# Patient Record
Sex: Male | Born: 1960 | Race: White | Hispanic: No | Marital: Married | Smoking: Never smoker
Health system: Southern US, Community
[De-identification: ages and names within clinical notes are randomized; demographics above are authoritative.]

## PROBLEM LIST (undated history)

## (undated) DIAGNOSIS — I341 Nonrheumatic mitral (valve) prolapse: Secondary | ICD-10-CM

## (undated) DIAGNOSIS — I219 Acute myocardial infarction, unspecified: Secondary | ICD-10-CM

## (undated) DIAGNOSIS — Q249 Congenital malformation of heart, unspecified: Secondary | ICD-10-CM

## (undated) DIAGNOSIS — I2510 Atherosclerotic heart disease of native coronary artery without angina pectoris: Secondary | ICD-10-CM

## (undated) HISTORY — PX: CARDIAC SURGERY: SHX584

## (undated) HISTORY — PX: APPENDECTOMY: SHX54

## (undated) HISTORY — PX: CHOLECYSTECTOMY: SHX55

## (undated) HISTORY — PX: ARM NEUROPLASTY: SHX1184

---

## 2016-12-10 ENCOUNTER — Observation Stay (HOSPITAL_COMMUNITY)
Admission: EM | Admit: 2016-12-10 | Discharge: 2016-12-12 | Disposition: A | Discharge status: Home / Self Care | Payer: 59 | Attending: Family Medicine | Admitting: Family Medicine

## 2016-12-10 ENCOUNTER — Encounter (HOSPITAL_COMMUNITY): Payer: Self-pay

## 2016-12-10 ENCOUNTER — Emergency Department (HOSPITAL_COMMUNITY): Payer: 59

## 2016-12-10 ENCOUNTER — Other Ambulatory Visit: Payer: Self-pay | Admitting: Emergency Medicine

## 2016-12-10 DIAGNOSIS — R0602 Shortness of breath: Secondary | ICD-10-CM | POA: Diagnosis present

## 2016-12-10 DIAGNOSIS — R110 Nausea: Secondary | ICD-10-CM | POA: Insufficient documentation

## 2016-12-10 DIAGNOSIS — R001 Bradycardia, unspecified: Secondary | ICD-10-CM | POA: Diagnosis present

## 2016-12-10 DIAGNOSIS — Z7982 Long term (current) use of aspirin: Secondary | ICD-10-CM | POA: Diagnosis not present

## 2016-12-10 DIAGNOSIS — R0600 Dyspnea, unspecified: Secondary | ICD-10-CM | POA: Diagnosis not present

## 2016-12-10 DIAGNOSIS — I2510 Atherosclerotic heart disease of native coronary artery without angina pectoris: Secondary | ICD-10-CM | POA: Insufficient documentation

## 2016-12-10 DIAGNOSIS — I959 Hypotension, unspecified: Secondary | ICD-10-CM | POA: Diagnosis not present

## 2016-12-10 DIAGNOSIS — I252 Old myocardial infarction: Secondary | ICD-10-CM | POA: Diagnosis not present

## 2016-12-10 DIAGNOSIS — R55 Syncope and collapse: Secondary | ICD-10-CM | POA: Diagnosis not present

## 2016-12-10 DIAGNOSIS — I517 Cardiomegaly: Principal | ICD-10-CM | POA: Insufficient documentation

## 2016-12-10 DIAGNOSIS — R0989 Other specified symptoms and signs involving the circulatory and respiratory systems: Secondary | ICD-10-CM | POA: Diagnosis present

## 2016-12-10 DIAGNOSIS — R079 Chest pain, unspecified: Secondary | ICD-10-CM | POA: Diagnosis not present

## 2016-12-10 DIAGNOSIS — I5041 Acute combined systolic (congestive) and diastolic (congestive) heart failure: Secondary | ICD-10-CM

## 2016-12-10 DIAGNOSIS — N179 Acute kidney failure, unspecified: Secondary | ICD-10-CM

## 2016-12-10 HISTORY — DX: Congenital malformation of heart, unspecified: Q24.9

## 2016-12-10 HISTORY — DX: Acute myocardial infarction, unspecified: I21.9

## 2016-12-10 HISTORY — DX: Atherosclerotic heart disease of native coronary artery without angina pectoris: I25.10

## 2016-12-10 HISTORY — DX: Nonrheumatic mitral (valve) prolapse: I34.1

## 2016-12-10 LAB — BASIC METABOLIC PANEL
Anion gap: 5 (ref 5–15)
BUN: 11 mg/dL (ref 6–20)
CHLORIDE: 105 mmol/L (ref 101–111)
CO2: 27 mmol/L (ref 22–32)
CREATININE: 1.08 mg/dL (ref 0.61–1.24)
Calcium: 9 mg/dL (ref 8.9–10.3)
GFR calc Af Amer: >60 mL/min (ref >60)
GFR calc non Af Amer: >60 mL/min (ref >60)
Glucose, Bld: 99 mg/dL (ref 65–99)
POTASSIUM: 4.5 mmol/L (ref 3.5–5.1)
SODIUM: 137 mmol/L (ref 135–145)

## 2016-12-10 LAB — TSH: TSH: 5.754 u[IU]/mL — AB (ref 0.350–4.500)

## 2016-12-10 LAB — CBC WITH DIFFERENTIAL/PLATELET
Basophils Absolute: 0 10*3/uL (ref 0.0–0.1)
Basophils Relative: 0 %
EOS PCT: 0 %
Eosinophils Absolute: 0 10*3/uL (ref 0.0–0.7)
HCT: 40.5 % (ref 39.0–52.0)
HEMOGLOBIN: 13.6 g/dL (ref 13.0–17.0)
LYMPHS ABS: 1.6 10*3/uL (ref 0.7–4.0)
LYMPHS PCT: 24 %
MCH: 30.4 pg (ref 26.0–34.0)
MCHC: 33.6 g/dL (ref 30.0–36.0)
MCV: 90.6 fL (ref 78.0–100.0)
MONOS PCT: 6 %
Monocytes Absolute: 0.4 10*3/uL (ref 0.1–1.0)
NEUTROS PCT: 70 %
Neutro Abs: 4.6 10*3/uL (ref 1.7–7.7)
Platelets: 175 10*3/uL (ref 150–400)
RBC: 4.47 MIL/uL (ref 4.22–5.81)
RDW: 12.7 % (ref 11.5–15.5)
WBC: 6.6 10*3/uL (ref 4.0–10.5)

## 2016-12-10 LAB — LIPID PANEL
Cholesterol: 153 mg/dL (ref 0–200)
HDL: 39 mg/dL — ABNORMAL LOW (ref >40)
LDL CALC: 93 mg/dL (ref 0–99)
Total CHOL/HDL Ratio: 3.9 RATIO
Triglycerides: 105 mg/dL (ref <150)
VLDL: 21 mg/dL (ref 0–40)

## 2016-12-10 LAB — BRAIN NATRIURETIC PEPTIDE: B NATRIURETIC PEPTIDE 5: 35.9 pg/mL (ref 0.0–100.0)

## 2016-12-10 LAB — TROPONIN I

## 2016-12-10 LAB — I-STAT TROPONIN, ED: Troponin i, poc: 0 ng/mL (ref 0.00–0.08)

## 2016-12-10 LAB — D-DIMER, QUANTITATIVE: D-Dimer, Quant: <0.27 ug/mL-FEU (ref 0.00–0.50)

## 2016-12-10 MED ORDER — SENNOSIDES-DOCUSATE SODIUM 8.6-50 MG PO TABS
1.0000 | ORAL_TABLET | Freq: Every evening | ORAL | Status: DC | PRN
  Filled 2016-12-10: qty 1

## 2016-12-10 MED ORDER — SODIUM CHLORIDE 0.9% FLUSH
3.0000 mL | Freq: Two times a day (BID) | INTRAVENOUS | Status: DC
  Administered 2016-12-11: 3 mL via INTRAVENOUS

## 2016-12-10 MED ORDER — ONDANSETRON HCL 4 MG PO TABS: 4.0000 mg | ORAL_TABLET | Freq: Four times a day (QID) | ORAL | Status: DC | PRN

## 2016-12-10 MED ORDER — HYDROCODONE-ACETAMINOPHEN 5-325 MG PO TABS
1.0000 | ORAL_TABLET | ORAL | Status: DC | PRN
  Administered 2016-12-10 – 2016-12-11 (×2): 2 via ORAL
  Filled 2016-12-10 (×2): qty 2

## 2016-12-10 MED ORDER — ACETAMINOPHEN 650 MG RE SUPP: 650.0000 mg | Freq: Four times a day (QID) | RECTAL | Status: DC | PRN

## 2016-12-10 MED ORDER — SODIUM CHLORIDE 0.9% FLUSH
3.0000 mL | Freq: Two times a day (BID) | INTRAVENOUS | Status: DC
  Administered 2016-12-10 – 2016-12-12 (×3): 3 mL via INTRAVENOUS

## 2016-12-10 MED ORDER — ONDANSETRON HCL 4 MG/2ML IJ SOLN: 4.0000 mg | Freq: Four times a day (QID) | INTRAMUSCULAR | Status: DC | PRN

## 2016-12-10 MED ORDER — ACETAMINOPHEN 325 MG PO TABS
650.0000 mg | ORAL_TABLET | Freq: Four times a day (QID) | ORAL | Status: DC | PRN
  Administered 2016-12-11: 650 mg via ORAL
  Filled 2016-12-10: qty 2

## 2016-12-10 MED ORDER — ENOXAPARIN SODIUM 40 MG/0.4ML ~~LOC~~ SOLN
40.0000 mg | SUBCUTANEOUS | Status: DC
  Administered 2016-12-10 – 2016-12-11 (×2): 40 mg via SUBCUTANEOUS
  Filled 2016-12-10 (×2): qty 0.4

## 2016-12-10 MED ORDER — ZOLPIDEM TARTRATE 5 MG PO TABS: 5.0000 mg | ORAL_TABLET | Freq: Every evening | ORAL | Status: DC | PRN

## 2016-12-10 MED ORDER — SODIUM CHLORIDE 0.9 % IV SOLN: 250.0000 mL | INTRAVENOUS | Status: DC | PRN

## 2016-12-10 MED ORDER — SODIUM CHLORIDE 0.9 % IV BOLUS (SEPSIS)
500.0000 mL | Freq: Once | INTRAVENOUS | Status: AC
Start: 2016-12-10 — End: 2016-12-10
  Administered 2016-12-10: 500 mL via INTRAVENOUS

## 2016-12-10 NOTE — Consult Note (Signed)
Cardiology Consultation:   Patient ID: Micheal Cross; [id number]; 05-Mar-1961   Admit date: 12/10/2016 Date of Consult: 12/10/2016  Primary Care Provider: Medicine, [facility] Primary Cardiologist: Lorelei Pont MD ([facility]/ [phone]) Primary Electrophysiologist:  None   Patient Profile:   Micheal Cross is a 56 y.o. 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 Neg Nuc stress test in 2015..  The patient is being seen today for the evaluation of chest pain and CHF at the request of Dr. Konrad Dolores.  History of Present Illness:   Micheal Cross 56 yo male Per Care Everywhere patient had been admitted in 2015 with chest pain syndrome and had an echocardiogram which showed normal LV function with mild valvular disease. He had a negative nuclear stress test.  He present to the ER with worsening SOB, occasional nocturnal dyspnea gradually worsening over 1 month. Today his SOB because suddenly worsened, it was stabbing left sided chest pain with associated nausea, left arm numbness, lightheadedness, near syncope while lifting steel in the past two weeks. Has had cramping in his legs but no swelling. Symptoms improved with SL nitro given in route.  ER work-up revealed normal BMP and CBC, LDL is 93, Neg d-dimer. Chest xray-- showed cardiomegaly and mild pulmonary vascular congestion is concerning for early congestive heart failure. BNP 35.9, Patient admitted to [facility] for CP rule out. HR 50s, respi 14, BP 100/66, 98% on room air.  The patient describes feeling so short of breath that his vision went black and he laid down on the ground before he passed out. When EMS arrived they helped him stand up and he felt that he would pass out again. He was given fluids before orthostatics could be checked.  He says that he did drink fluids and have breakfasts. He is concerned about his extensive cardiac hx and  family history. Internal medicine plans to cycle enzymes and do an echocardiogram.   Past Medical History:  Diagnosis Date  . Congenital Heart Disease   . Mitral valve prolapse     Past Surgical History:  Procedure Laterality Date  . APPENDECTOMY    . ARM NEUROPLASTY    . CHOLECYSTECTOMY       Inpatient Medications: Scheduled Meds: . enoxaparin (LOVENOX) injection  40 mg Subcutaneous Q24H  . sodium chloride flush  3 mL Intravenous Q12H  . sodium chloride flush  3 mL Intravenous Q12H   Continuous Infusions: . sodium chloride     PRN Meds: sodium chloride, acetaminophen **OR** acetaminophen, HYDROcodone-acetaminophen, ondansetron **OR** ondansetron (ZOFRAN) IV, senna-docusate, zolpidem  Allergies:   No Known Allergies  Social History:   Social History   Social History  . Marital status: Married    Spouse name: N/A  . Number of children: N/A  . Years of education: N/A   Occupational History  . Not on file.   Social History Main Topics  . Smoking status: Never Smoker  . Smokeless tobacco: Never Used  . Alcohol use No  . Drug use: No  . Sexual activity: Not on file   Other Topics Concern  . Not on file   Social History Narrative  . No narrative on file      Family History:  The patient's family history includes Alzheimer's disease in his mother; Bipolar disorder in his mother and sister; COPD in his mother; Colon cancer in his sister; Diabetes in his father; Heart attack  in his maternal grandmother and paternal grandmother; Heart attack (age of onset: 8453) in his father; Heart disease in his maternal grandfather and paternal grandfather; Hyperlipidemia in his father; Hypertension in his father; Migraines in his sister; Multiple sclerosis in his sister; Thyroid disease in his father and sister.  ROS:  Please see the history of present illness.  All other ROS reviewed and negative.     Physical Exam/Data:   Vitals:   12/10/16 1500 12/10/16 1515 12/10/16 1600  12/10/16 1700  BP: 98/65 101/66 (!) 112/53   Pulse: (!) 59 (!) 55 66   Resp: 17 14 15 20   Temp:    97.7 F (36.5 C)  TempSrc:    Oral  SpO2: 96% 98% 100% 100%  Weight:      Height:       No intake or output data in the 24 hours ending 12/10/16 1741 Filed Weights   12/10/16 1209  Weight: 158 lb (71.7 kg)   Body mass index is 26.29 kg/m.  General: Well developed, well nourished, in no acute distress.  Head: Normocephalic, atraumatic, Neck: Negative for carotid bruits. JVD not elevated. Lungs: Clear bilaterally to auscultation without wheezes, rales, or rhonchi. Breathing is unlabored. Heart: RRR with S1 S2. No murmurs, rubs, or gallops appreciated. Abdomen: Soft, non-tender, non-distended with normoactive bowel sounds. Msk:  Strength and tone appear normal for age. Extremities: No clubbing or cyanosis. No edema.  Neuro: Alert and oriented X 3. No facial asymmetry. Psych:  Responds to questions appropriately with a normal affect.  EKG:  The EKG was personally reviewed and demonstrates HR 63, NSR  Relevant CV Studies: Echocardiogram is pending.  Laboratory Data:  Chemistry  Recent Labs Lab 12/10/16 1257  NA 137  K 4.5  CL 105  CO2 27  GLUCOSE 99  BUN 11  CREATININE 1.08  CALCIUM 9.0  GFRNONAA >60  GFRAA >60  ANIONGAP 5    No results for input(s): PROT, ALBUMIN, AST, ALT, ALKPHOS, BILITOT in the last 168 hours. Hematology  Recent Labs Lab 12/10/16 1257  WBC 6.6  RBC 4.47  HGB 13.6  HCT 40.5  MCV 90.6  MCH 30.4  MCHC 33.6  RDW 12.7  PLT 175   Cardiac EnzymesNo results for input(s): TROPONINI in the last 168 hours.   Recent Labs Lab 12/10/16 1307  TROPIPOC 0.00    BNP  Recent Labs Lab 12/10/16 1257  BNP 35.9    DDimer   Recent Labs Lab 12/10/16 1257  DDIMER <0.27    Radiology/Studies:  Dg Chest Portable 1 View  Result Date: 12/10/2016 CLINICAL DATA:  Left central chest pain extending to left upper extremity. Shortness of breath.  EXAM: PORTABLE CHEST 1 VIEW COMPARISON:  None. FINDINGS: The heart is mildly enlarged. Mild pulmonary vascular congestion is present. There is no frank edema. No focal airspace disease is present. There are no definite effusions. The visualized soft tissues and bony thorax are unremarkable. IMPRESSION: 1. Cardiomegaly and mild pulmonary vascular congestion is concerning for early congestive heart failure. Electronically Signed   By: Marin Robertshristopher  Mattern M.D.   On: 12/10/2016 12:54    Assessment and Plan:    1. Shortness of breath with chest pains: Pt reports having any MI in 2015,I could not find documentation of this. He had a normal Stress Nuc in 2015 but says he passed out during the test. He has been having progressively worsening episodes of pain. Lab/image wise he does not appear fluid overload but clinically he is  described fluid overload. His presentation is atypical with ongoing sharp stabbing pleuritic sounding pains to his left chest. He does have typical symptoms of chest pressure/weight and diaphoresis. -- agree with cycling troponins and echocardiogram. -- keep NPO after midnight. If troponin turns positive, chest pain persists, EKG changes or or echocardiogram is abnormal then will consider Nuc stress test.  2. Near-Syncope: pt sounds like he was orthostatic on arrival, he has been hydrated with ~ 500 mL. Has been up and ambulatory since arriving to the hospital without orthostasis s/sx.    Signed, Micheal Rotunda, MD  12/10/2016 5:41 PM  History and all data above reviewed.  Patient examined.  I agree with the findings as above.  The patient has a past history of chest pain with a negative stress test in the past.  He reports that he has had an MI but he says that he has never had a cath before.  I have no details on this.  He presents with stabbing chest pains today.  he also reports some palpitations.  Today he had presyncope.  The patient exam reveals COR:RRR  ,  Lungs: Clear  ,   Abd: Positive bowel sounds, no rebound no guarding, Ext No edemqa  .  All available labs, radiology testing, previous records reviewed. Agree with documented assessment and plan. Chest pain:  This is atypical.  There is no objective evidence of ischemia.  He has some early repol changes on his EKG.  Initial enzymes are negative.  There was a mention of vascular congestion on chest X ray .  However, he has a normal BNP and no physical evidence of CHF.  If his enzymes are negative I will plan an exercise perfusion study in the AM.  Palpitations:  He reports these and near syncope.  He will need an out patient event monitor.     Micheal Cross  5:42 PM  12/10/2016

## 2016-12-10 NOTE — H&P (Signed)
History and Physical    Micheal Cross ZOX:[id number] DOB: 1960/11/14 DOA: 12/10/2016  PCP: Medicine, [facility] Patient coming from: work  Chief Complaint: syncope, chest pain  HPI: Micheal Cross is a 56 y.o. male with medical history significant congenital heart defect surgical repair 1964, CAD, chest pain syndrome presents to emergency Department chief complaint of syncope and chest pain. Initial evaluation reveals initial troponin negative EKG without acute changes he does have a slightly soft blood pressures somewhat bradycardic. 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 He reports he was "sweating a lot". Suddenly he felt a sharp stabbing pain in his left anterior chest. Associated symptoms include shortness of breath nausea without vomiting left arm tingling. He states "I could feel myself passing out so I laid down on the ground". He states he could hear people talking around him but he could not respond. EMS was called when he awakened and they helped him up he felt "dizzy". He reports drinking lots of water and Gatorade to maintain his hydration. 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 He denies headache lower extremity edema orthopnea. He denies fever chills abdominal pain dysuria hematuria frequency or urgency. He states he's had this in the past and he has had a stress test at which he "passed out and they told me it was a normal test". He was given aspirin and nitroglycerin have the time of admission he is pain-free.    ED Course: In the emergency department he's afebrile, Lopressor the low end of normal. Heart rate range 50-64 he is not hypoxic.  Review of Systems: As per HPI otherwise all other systems reviewed and are negative.   Ambulatory Status: Ambulates independently is independent with  ADLs.  Past Medical History:  Diagnosis Date  . Coronary artery disease   . Mitral valve prolapse   . Myocardial infarction [facility])     Past Surgical History:  Procedure Laterality Date  . APPENDECTOMY    . ARM NEUROPLASTY    . CHOLECYSTECTOMY    . CORONARY ARTERY BYPASS GRAFT      Social History   Social History  . Marital status: Married    Spouse name: N/A  . Number of children: N/A  . Years of education: N/A   Occupational History  . Not on file.   Social History Main Topics  . Smoking status: Never Smoker  . Smokeless tobacco: Never Used  . Alcohol use No  . Drug use: No  . Sexual activity: Not on file   Other Topics Concern  . Not on file   Social History Narrative  . No narrative on file    No Known Allergies  Family History  Problem Relation Age of Onset  . COPD Mother   . Bipolar disorder Mother   . Alzheimer's disease Mother   . Heart attack Father   . Diabetes Father   . Heart disease Father   . Hypertension Father   . Hyperlipidemia Father   . Thyroid disease Father   . Colon cancer Sister   . Bipolar disorder Sister   . Migraines Sister   . Thyroid disease Sister   . Multiple sclerosis Sister   . Heart attack Maternal Grandmother   . Heart disease Maternal Grandfather   . Heart attack Paternal Grandmother   .  Heart disease Paternal Grandfather     Prior to Admission medications   Medication Sig Start Date End Date Taking? Authorizing Provider  acetaminophen (TYLENOL) 325 MG tablet Take 650 mg by mouth every 6 (six) hours as needed for mild pain.   Yes [provider]  aspirin 81 MG chewable tablet Chew 81 mg by mouth daily.   Yes [provider]  ibuprofen (ADVIL,MOTRIN) 200 MG tablet Take 200 mg by mouth every 6 (six) hours as needed for moderate pain.   Yes [provider]  Multiple Vitamin (MULTIVITAMIN) tablet Take 1 tablet by mouth daily.   Yes [provider]  POTASSIUM PO Take 1 capsule by  mouth daily.   Yes [provider]  vitamin E 400 UNIT capsule Take 400 Units by mouth daily.   Yes [provider]    Physical Exam: Vitals:   12/10/16 1430 12/10/16 1445 12/10/16 1500 12/10/16 1515  BP: 100/67 (!) 99/47 98/65 101/66  Pulse: (!) 57 (!) 59 (!) 59 (!) 55  Resp: 16 17 17 14   Temp:      SpO2: 100% 98% 96% 98%  Weight:      Height:         General:  Appears calm and comfortable Eyes:  PERRL, EOMI, normal lids, iris ENT:  grossly normal hearing, lips & tongue, mmm Neck:  no LAD, masses or thyromegaly Cardiovascular:  RRR, no m/r/g. No LE edema.  Respiratory:  CTA bilaterally, no w/r/r. Normal respiratory effort. Abdomen:  soft, ntnd, NABS Skin:  no rash or induration seen on limited exam Musculoskeletal:  grossly normal tone BUE/BLE, good ROM, no bony abnormality Psychiatric:  grossly normal mood and affect, speech fluent and appropriate, AOx3 Neurologic:  CN 2-12 grossly intact, moves all extremities in coordinated fashion, sensation intact  Labs on Admission: I have personally reviewed following labs and imaging studies  CBC:  Recent Labs Lab 12/10/16 1257  WBC 6.6  NEUTROABS 4.6  HGB 13.6  HCT 40.5  MCV 90.6  PLT 175   Basic Metabolic Panel:  Recent Labs Lab 12/10/16 1257  NA 137  K 4.5  CL 105  CO2 27  GLUCOSE 99  BUN 11  CREATININE 1.08  CALCIUM 9.0   GFR: Estimated Creatinine Clearance: 68 mL/min (by C-G formula based on SCr of 1.08 mg/dL). Liver Function Tests: No results for input(s): AST, ALT, ALKPHOS, BILITOT, PROT, ALBUMIN in the last 168 hours. No results for input(s): LIPASE, AMYLASE in the last 168 hours. No results for input(s): AMMONIA in the last 168 hours. Coagulation Profile: No results for input(s): INR, PROTIME in the last 168 hours. Cardiac Enzymes: No results for input(s): CKTOTAL, CKMB, CKMBINDEX, TROPONINI in the last 168 hours. BNP (last 3 results) No results for input(s): PROBNP in the last  8760 hours. HbA1C: No results for input(s): HGBA1C in the last 72 hours. CBG: No results for input(s): GLUCAP in the last 168 hours. Lipid Profile:  Recent Labs  12/10/16 1257  CHOL 153  HDL 39*  LDLCALC 93  TRIG [id number]  CHOLHDL 3.9   Thyroid Function Tests: No results for input(s): TSH, T4TOTAL, FREET4, T3FREE, THYROIDAB in the last 72 hours. Anemia Panel: No results for input(s): VITAMINB12, FOLATE, FERRITIN, TIBC, IRON, RETICCTPCT in the last 72 hours. Urine analysis: No results found for: COLORURINE, APPEARANCEUR, LABSPEC, PHURINE, GLUCOSEU, HGBUR, BILIRUBINUR, KETONESUR, PROTEINUR, UROBILINOGEN, NITRITE, LEUKOCYTESUR  Creatinine Clearance: Estimated Creatinine Clearance: 68 mL/min (by C-G formula based on SCr of 1.08 mg/dL).  Sepsis Labs: @LABRCNTIP (procalcitonin:4,lacticidven:4) )No results found for this or any previous visit (from the past 240 hour(s)).   Radiological Exams on Admission: Dg Chest Portable 1 View  Result Date: 12/10/2016 CLINICAL DATA:  Left central chest pain extending to left upper extremity. Shortness of breath. EXAM: PORTABLE CHEST 1 VIEW COMPARISON:  None. FINDINGS: The heart is mildly enlarged. Mild pulmonary vascular congestion is present. There is no frank edema. No focal airspace disease is present. There are no definite effusions. The visualized soft tissues and bony thorax are unremarkable. IMPRESSION: 1. Cardiomegaly and mild pulmonary vascular congestion is concerning for early congestive heart failure. Electronically Signed   By: Marin Roberts M.D.   On: 12/10/2016 12:54    EKG: Independently reviewed. Sinus rhythm Borderline right axis deviation ST elev, probable normal early repol pattern No STEMI. No old for comparison  Assessment/Plan Principal Problem:   Syncope, near Active Problems:   Chest pain   Bradycardia   Pulmonary congestion   Hypotension   #1. Syncope, near. Patient reports lying down before he passed out but  does state that he passed out. Sounds like he might of gotten dehydrated doing physical labor in the heat or possible vagal response with heavy lifting. He insists he has been drinking water and maintaining his hydration. EKG with sinus rhythm right axis deviation. Chest x-ray with cardiomegaly and mild pulmonary vascular congestion. BNP 35. Was not orthostatic on admission -Admit to telemetry -Cycle troponin -Serial EKG -Echocardiogram -IV fluids -Cardiology consult  #2. Chest pain/pulmonary congestion. Chart review indicates patient with a history of same. He states he had a stress test in 2015 and he "passed out". Chart review indicates this test was within the limits of normal. Chart review also reveals echo in the past with an EF of 55% and mild tricuspid and mitral valve regurg. Initial troponin negative. EKG as noted above. Chest x-ray as noted above. He is pain-free on admission. -Cycle troponins -Serial EKG -Echocardiogram -Supportive therapy -Cardiology consult  #3. Hypotension/bradycardia. Chest x-ray as noted above. EKG as noted above. May be related to a volume issue -IV fluids -Cardiology consult -Repeat orthostatics -Obtain TSH    DVT prophylaxis: lovenox  Code Status: full  Family Communication: wife and sister at bedside  Disposition Plan: home hopefully in am  Consults called: cardiology  Admission status: obs    Gwenyth Bender MD [facility]  If 7PM-7AM, please contact night-coverage [email or web address] Password [facility]  12/10/2016, 3:51 PM

## 2016-12-10 NOTE — ED Provider Notes (Signed)
[facility]-EMERG[facility]Y DEPT Provider Note   CSN: [id number] Arrival date & time: 12/10/16  1207     History   Chief Complaint Chief Complaint  Patient presents with  . Chest Pain    HPI Micheal Cross is a 56 y.o. male.  HPI   Pt with hx HLD, CAD, MI, congenital heart defect repair in 1964 p/w 1 month gradually worsening SOB, occasional nocturnal dyspnea.  Today he developed sudden SOB, stabbing left sided chest pain, nausea, left arm numbness, and lightheadedness, near syncope while lifting steel onto the back of a truck.  He is a Naval architect by profession and drove to [place] twice in the past two weeks.  Has cramping in his legs but no swelling.   Was given nitro on the way to the ED today with improvement of symptoms.  Continues to have left sided chest pain and SOB, nausea.   Cardiologist is in [place], [place], at [facility] - last stress test was several years ago during which he lost consciousness but was told it was a normal test.  Possible MI in 2015.    Past Medical History:  Diagnosis Date  . Congenital heart disease   . Coronary artery disease   . Mitral valve prolapse   . Myocardial infarction [facility])     Patient Active Problem List   Diagnosis Date Noted  . Chest pain 12/10/2016  . Bradycardia 12/10/2016  . Syncope, near 12/10/2016  . Pulmonary congestion 12/10/2016  . Hypotension 12/10/2016  . Dyspnea   . Acute combined systolic and diastolic congestive heart failure [facility])     Past Surgical History:  Procedure Laterality Date  . APPENDECTOMY    . ARM NEUROPLASTY    . CARDIAC SURGERY     Repair of congenital heart disease (no details)  . CHOLECYSTECTOMY         Home Medications    Prior to Admission medications   Medication Sig Start Date End Date Taking? Authorizing Provider  acetaminophen (TYLENOL) 325 MG tablet Take 650 mg by mouth every 6 (six) hours as needed for mild pain.   Yes [provider]  aspirin 81 MG chewable tablet Chew 81 mg  by mouth daily.   Yes [provider]  ibuprofen (ADVIL,MOTRIN) 200 MG tablet Take 200 mg by mouth every 6 (six) hours as needed for moderate pain.   Yes [provider]  Multiple Vitamin (MULTIVITAMIN) tablet Take 1 tablet by mouth daily.   Yes [provider]  POTASSIUM PO Take 1 capsule by mouth daily.   Yes [provider]  vitamin E 400 UNIT capsule Take 400 Units by mouth daily.   Yes [provider]    Family History Family History  Problem Relation Age of Onset  . COPD Mother   . Bipolar disorder Mother   . Alzheimer's disease Mother   . Heart attack Father 41  . Diabetes Father   . Hypertension Father   . Hyperlipidemia Father   . Thyroid disease Father   . Colon cancer Sister   . Bipolar disorder Sister   . Migraines Sister   . Thyroid disease Sister   . Multiple sclerosis Sister   . Heart attack Maternal Grandmother   . Heart disease Maternal Grandfather   . Heart attack Paternal Grandmother   . Heart disease Paternal Grandfather     Social History Social History  Substance Use Topics  . Smoking status: Never Smoker  . Smokeless tobacco: Never Used  . Alcohol use  No     Allergies   Patient has no known allergies.   Review of Systems Review of Systems  All other systems reviewed and are negative.    Physical Exam Updated Vital Signs BP 113/64   Pulse 61   Temp 98.1 F (36.7 C) (Oral)   Resp 20   Ht 5\' 5"  (1.651 m)   Wt 70.9 kg (156 lb 3.2 oz)   SpO2 96%   BMI 25.99 kg/m   Physical Exam  Constitutional: He appears well-developed and well-nourished. No distress.  HENT:  Head: Normocephalic and atraumatic.  Neck: Neck supple.  Cardiovascular: Normal rate and regular rhythm.   Pulmonary/Chest: Effort normal and breath sounds normal. No respiratory distress. He has no wheezes. He has no rales.  Abdominal: Soft. He exhibits no distension and no mass. There is no tenderness. There is no rebound and no  guarding.  Musculoskeletal: He exhibits no edema.  Neurological: He is alert. He exhibits normal muscle tone.  Skin: He is not diaphoretic.  Nursing note and vitals reviewed.    ED Treatments / Results  Labs (all labs ordered are listed, but only abnormal results are displayed) Labs Reviewed  LIPID PANEL - Abnormal; Notable for the following:       Result Value   HDL 39 (*)    All other components within normal limits  TSH - Abnormal; Notable for the following:    TSH 5.754 (*)    All other components within normal limits  BASIC METABOLIC PANEL - Abnormal; Notable for the following:    Glucose, Bld 123 (*)    Creatinine, Ser 1.34 (*)    Calcium 8.6 (*)    GFR calc non Af Amer 59 (*)    All other components within normal limits  GLUCOSE, CAPILLARY - Abnormal; Notable for the following:    Glucose-Capillary 104 (*)    All other components within normal limits  BASIC METABOLIC PANEL  CBC WITH DIFFERENTIAL/PLATELET  D-DIMER, QUANTITATIVE (NOT AT [facility])  BRAIN NATRIURETIC PEPTIDE  TROPONIN I  TROPONIN I  CBC  HIV ANTIBODY (ROUTINE TESTING)  I-STAT TROPOININ, ED    EKG  EKG Interpretation  Date/Time:  Monday December 10 2016 12:10:46 EDT Ventricular Rate:  63 PR Interval:    QRS Duration: 79 QT Interval:  394 QTC Calculation: 404 R Axis:   90 Text Interpretation:  Sinus rhythm Borderline right axis deviation ST elev, probable normal early repol pattern No STEMI. No old for comparison.  Confirmed by Alona BeneLong, Joshua [phone]([id number]) on 12/10/2016 12:19:36 PM       Radiology Dg Chest Portable 1 View  Result Date: 12/10/2016 CLINICAL DATA:  Left central chest pain extending to left upper extremity. Shortness of breath. EXAM: PORTABLE CHEST 1 VIEW COMPARISON:  None. FINDINGS: The heart is mildly enlarged. Mild pulmonary vascular congestion is present. There is no frank edema. No focal airspace disease is present. There are no definite effusions. The visualized soft tissues and bony thorax  are unremarkable. IMPRESSION: 1. Cardiomegaly and mild pulmonary vascular congestion is concerning for early congestive heart failure. Electronically Signed   By: Marin Robertshristopher  Mattern M.D.   On: 12/10/2016 12:54    Procedures Procedures (including critical care time)  Medications Ordered in ED Medications  sodium chloride flush (NS) 0.9 % injection 3 mL (3 mLs Intravenous Given 12/11/16 1000)  enoxaparin (LOVENOX) injection 40 mg (40 mg Subcutaneous Given 12/10/16 2127)  acetaminophen (TYLENOL) tablet 650 mg (650 mg Oral Given 12/11/16 0535)  Or  acetaminophen (TYLENOL) suppository 650 mg ( Rectal See Alternative 12/11/16 0535)  sodium chloride flush (NS) 0.9 % injection 3 mL (3 mLs Intravenous Given 12/10/16 2129)  0.9 %  sodium chloride infusion (not administered)  HYDROcodone-acetaminophen (NORCO/VICODIN) 5-325 MG per tablet 1-2 tablet (2 tablets Oral Given 12/10/16 2127)  zolpidem (AMBIEN) tablet 5 mg (not administered)  senna-docusate (Senokot-S) tablet 1 tablet (not administered)  ondansetron (ZOFRAN) tablet 4 mg (not administered)    Or  ondansetron (ZOFRAN) injection 4 mg (not administered)  sodium chloride 0.9 % bolus 500 mL (0 mLs Intravenous Stopped 12/10/16 1354)  technetium tetrofosmin (TC-MYOVIEW) injection 10 millicurie (10 millicuries Intravenous Contrast Given 12/11/16 0835)  technetium tetrofosmin (TC-MYOVIEW) injection 30 millicurie (30 millicuries Intravenous Contrast Given 12/11/16 1015)     Initial Impression / Assessment and Plan / ED Course  I have reviewed the triage vital signs and the nursing notes.  Pertinent labs & imaging results that were available during my care of the patient were reviewed by me and considered in my medical decision making (see chart for details).  Clinical Course as of Dec 11 1101  Mon Dec 10, 2016  1412 I spoke with Toya Smothers, APP [facility], who accepts patient for admission.  Will also call cardiology for consult.    [EW]    1421 I spoke with Trish, cardmaster.  Cardiology will consult.    [EW]    Clinical Course User Index [EW] Trixie Dredge, [place]    Pt with chest pain, near syncope with exertion.  Initial workup demonstrates possible early CHF.  Admitted to [facility] for further evaluation and rule out ACS.  Cardiology to consult.    Final Clinical Impressions(s) / ED Diagnoses   Final diagnoses:  Chest pain, unspecified type  Dyspnea, unspecified type  Pulmonary vascular congestion  Near syncope  Cardiomegaly    New Prescriptions Current Discharge Medication List       Trixie Dredge, [place] 12/11/16 1106    Long, Arlyss Repress, MD 12/11/16 1525

## 2016-12-10 NOTE — ED Notes (Signed)
Attempted report x1. 

## 2016-12-10 NOTE — ED Triage Notes (Addendum)
Pt presents to the ed with complaints of central left chest pain that is sharp radiating into his left arm that feels tingly, pt received 1 nitro with ems that did help. Reports shortness of breath over the last couple weeks at rest.  Pt has an extensive cardiac history. Pt received 1 nitro 324 asa and 4 of zofran in route

## 2016-12-10 NOTE — ED Notes (Signed)
Admitting doctor at bedside 

## 2016-12-11 ENCOUNTER — Other Ambulatory Visit: Payer: Self-pay | Admitting: Student

## 2016-12-11 ENCOUNTER — Observation Stay (HOSPITAL_BASED_OUTPATIENT_CLINIC_OR_DEPARTMENT_OTHER): Payer: 59

## 2016-12-11 ENCOUNTER — Other Ambulatory Visit: Payer: Self-pay | Admitting: Cardiology

## 2016-12-11 DIAGNOSIS — R079 Chest pain, unspecified: Secondary | ICD-10-CM | POA: Diagnosis not present

## 2016-12-11 DIAGNOSIS — N179 Acute kidney failure, unspecified: Secondary | ICD-10-CM

## 2016-12-11 DIAGNOSIS — R002 Palpitations: Secondary | ICD-10-CM

## 2016-12-11 DIAGNOSIS — I959 Hypotension, unspecified: Secondary | ICD-10-CM | POA: Diagnosis not present

## 2016-12-11 DIAGNOSIS — R55 Syncope and collapse: Secondary | ICD-10-CM

## 2016-12-11 DIAGNOSIS — I495 Sick sinus syndrome: Secondary | ICD-10-CM

## 2016-12-11 LAB — ECHOCARDIOGRAM COMPLETE
Ao-asc: 35 cm
E decel time: 229 msec
EERAT: 10.8
FS: 39 % (ref 28–44)
HEIGHTINCHES: 65 in
IVS/LV PW RATIO, ED: 0.97
LA ID, A-P, ES: 30 mm
LA diam end sys: 30 mm
LA diam index: 1.65 cm/m2
LA vol A4C: 96.6 ml
LA vol: 85.9 mL
LAVOLIN: 47.3 mL/m2
LV E/e' medial: 10.8
LV TDI E'LATERAL: 9.03
LVEEAVG: 10.8
LVELAT: 9.03 cm/s
LVOT area: 3.14 cm2
LVOTD: 20 mm
MV Dec: 229
MV Peak grad: 4 mmHg
MV pk A vel: 61.1 m/s
MVPKEVEL: 97.5 m/s
PW: 9.87 mm — AB (ref 0.6–1.1)
RV LATERAL S' VELOCITY: 12.4 cm/s
TAPSE: 30.5 mm
TDI e' medial: 8.92
WEIGHTICAEL: 2499.2 [oz_av]

## 2016-12-11 LAB — CBC
HEMATOCRIT: 40.5 % (ref 39.0–52.0)
HEMOGLOBIN: 13 g/dL (ref 13.0–17.0)
MCH: 29.5 pg (ref 26.0–34.0)
MCHC: 32.1 g/dL (ref 30.0–36.0)
MCV: 92 fL (ref 78.0–100.0)
Platelets: 173 10*3/uL (ref 150–400)
RBC: 4.4 MIL/uL (ref 4.22–5.81)
RDW: 12.8 % (ref 11.5–15.5)
WBC: 7.3 10*3/uL (ref 4.0–10.5)

## 2016-12-11 LAB — BASIC METABOLIC PANEL
ANION GAP: 5 (ref 5–15)
BUN: 13 mg/dL (ref 6–20)
CHLORIDE: 105 mmol/L (ref 101–111)
CO2: 27 mmol/L (ref 22–32)
Calcium: 8.6 mg/dL — ABNORMAL LOW (ref 8.9–10.3)
Creatinine, Ser: 1.34 mg/dL — ABNORMAL HIGH (ref 0.61–1.24)
GFR calc non Af Amer: 59 mL/min — ABNORMAL LOW (ref >60)
Glucose, Bld: 123 mg/dL — ABNORMAL HIGH (ref 65–99)
Potassium: 3.8 mmol/L (ref 3.5–5.1)
Sodium: 137 mmol/L (ref 135–145)

## 2016-12-11 LAB — NM MYOCAR MULTI W/SPECT W/WALL MOTION / EF
CSEPED: 8 min
CSEPHR: 94 %
CSEPPHR: 157 {beats}/min
Estimated workload: 10.1 METS
Exercise duration (sec): 30 s
MPHR: 166 {beats}/min
Rest HR: 58 {beats}/min

## 2016-12-11 LAB — GLUCOSE, CAPILLARY: Glucose-Capillary: 104 mg/dL — ABNORMAL HIGH (ref 65–99)

## 2016-12-11 MED ORDER — TECHNETIUM TC 99M TETROFOSMIN IV KIT
10.0000 | PACK | Freq: Once | INTRAVENOUS | Status: AC | PRN
  Administered 2016-12-11: 10 via INTRAVENOUS

## 2016-12-11 MED ORDER — TECHNETIUM TC 99M TETROFOSMIN IV KIT
30.0000 | PACK | Freq: Once | INTRAVENOUS | Status: AC | PRN
  Administered 2016-12-11: 30 via INTRAVENOUS

## 2016-12-11 MED ORDER — SODIUM CHLORIDE 0.9 % IV BOLUS (SEPSIS): 1000.0000 mL | Freq: Once | INTRAVENOUS | Status: DC

## 2016-12-11 MED ORDER — SODIUM CHLORIDE 0.9 % IV SOLN
INTRAVENOUS | Status: DC
  Administered 2016-12-11: 20:00:00 via INTRAVENOUS

## 2016-12-11 NOTE — Plan of Care (Signed)
Problem: Safety: Goal: Ability to remain free from injury will improve Outcome: Progressing Wife at bedside. Assists to bathroom. Urinal given as alternative to walking to the bathroom due to headache and dizziness which patient stated he came in with.   Problem: Physical Regulation: Goal: Ability to maintain clinical measurements within normal limits will improve Outcome: Progressing At rest, patient's HR maintained in the 40-50's. At times could go up into the 60's. In chart, a strip was documented where there were multiple beats (that equated to 70 bmp) then one missed beat and went back into SB 40-50's. Patient has had no new symptoms overnight. Norco worked for Headache/Intermittent chest pain.   Comments: Patient NPO for stress test this morning.

## 2016-12-11 NOTE — Progress Notes (Addendum)
[facility] PROGRESS NOTE  Percell BostonGerek Cross MWN:[id number] DOB: April 25, 1961 DOA: 12/10/2016 PCP: Medicine, [facility]  Assessment/Plan:  #1. Dehydration and weakness (not syncope per cardio) -Admitted to telemetry -Cycled troponin -stress test neg, low risk -IV fluids -Cardiology consult  #2. Chest pain/pulmonary congestion. Chart review indicates patient with a history of same.  - ECHO complete - OP cardio fu  #3. Hypotension/bradycardia. Chest x-ray as noted above. EKG as noted above. May be related to a volume issue - gentle fluids overnight - d/c tomorrow if doing well - OP will get heart monitor per cardio  #4. AKI Likely from low BP (ATN), pt ambulatory and well appearing Baseline Cr 1.08, Cr 1.34 today Gentle hydration overnight FENA labs ordered I&O RENAL U/S ordered   Code Status: full Family Communication: wife at bedside (indicate person spoken with, relationship, and if by phone, the number) Disposition Plan: home, possibly tomorrow afternoon; pulm referral on d/c   Consultants:  cardio  Procedures:  none  Antibiotics:  n/a (indicate start date, and stop date if known)  HPI/Subjective: No events overnight.  Objective: Vitals:   12/11/16 1218 12/11/16 1500  BP: 97/66 (!) 87/41  Pulse:  (!) 56  Resp:  18  Temp:  98.4 F (36.9 C)    Intake/Output Summary (Last 24 hours) at 12/11/16 1813 Last data filed at 12/11/16 1200  Gross per 24 hour  Intake              440 ml  Output                0 ml  Net              440 ml   Filed Weights   12/10/16 1209 12/11/16 0615  Weight: 71.7 kg (158 lb) 70.9 kg (156 lb 3.2 oz)    Exam:  Physical Exam  Constitutional: She is oriented to person, place, and time. She appears well-developed and well-nourished.  HENT:  Head: Normocephalic and atraumatic.  Eyes: Pupils are equal, round, and reactive to light. EOM are normal.  Cardiovascular: Normal rate and regular rhythm.    Pulmonary/Chest: Effort normal and breath sounds normal. No respiratory distress.  Abdominal: Soft. Bowel sounds are normal. There is no tenderness.  Neurological: She is alert and oriented to person, place, and time. No cranial nerve deficit.     Data Reviewed: Basic Metabolic Panel:  Recent Labs Lab 12/10/16 1257 12/11/16 0201  NA 137 137  K 4.5 3.8  CL 105 105  CO2 27 27  GLUCOSE 99 123*  BUN 11 13  CREATININE 1.08 1.34*  CALCIUM 9.0 8.6*   Liver Function Tests: No results for input(s): AST, ALT, ALKPHOS, BILITOT, PROT, ALBUMIN in the last 168 hours. No results for input(s): LIPASE, AMYLASE in the last 168 hours. No results for input(s): AMMONIA in the last 168 hours. CBC:  Recent Labs Lab 12/10/16 1257 12/11/16 0201  WBC 6.6 7.3  NEUTROABS 4.6  --   HGB 13.6 13.0  HCT 40.5 40.5  MCV 90.6 92.0  PLT 175 173   Cardiac Enzymes:  Recent Labs Lab 12/10/16 1636 12/10/16 1941  TROPONINI <0.03 <0.03   BNP (last 3 results)  Recent Labs  12/10/16 1257  BNP 35.9    ProBNP (last 3 results) No results for input(s): PROBNP in the last 8760 hours.  CBG:  Recent Labs Lab 12/11/16 0753  GLUCAP 104*    No results found for this or any previous  visit (from the past 240 hour(s)).   Studies: Nm Myocar Multi W/spect W/wall Motion / Ef  Result Date: 12/11/2016 CLINICAL DATA:  Chest pain EXAM: MYOCARDIAL IMAGING WITH SPECT (REST AND EXERCISE) GATED LEFT VENTRICULAR WALL MOTION STUDY LEFT VENTRICULAR EJECTION FRACTION TECHNIQUE: Standard myocardial SPECT imaging was performed after resting intravenous injection of 10 mCi Tc-70m tetrofosmin. Subsequently, exercise tolerance test was performed by the patient under the supervision of the Cardiology staff. At peak-stress, 30 mCi Tc-60m tetrofosmin was injected intravenously and standard myocardial SPECT imaging was performed. Quantitative gated imaging was also performed to evaluate left ventricular wall motion, and  estimate left ventricular ejection fraction. COMPARISON:  None. FINDINGS: Perfusion: No decreased activity in the left ventricle on stress imaging to suggest reversible ischemia or infarction. Wall Motion: Normal left ventricular wall motion. No left ventricular dilation. Left Ventricular Ejection Fraction: 58 % End diastolic volume 96 ml End systolic volume 41 ml IMPRESSION: 1. No reversible ischemia or infarction. 2. Normal left ventricular wall motion. 3. Left ventricular ejection fraction 58% 4. Non invasive risk stratification*: Low *2012 Appropriate Use Criteria for Coronary Revascularization Focused Update: J Am Coll Cardiol. 2012;59(9):857-881. http://content.dementiazones.com.aspx?articleid=[email or web address] Electronically Signed   By: Charline Bills M.D.   On: 12/11/2016 13:06   Dg Chest Portable 1 View  Result Date: 12/10/2016 CLINICAL DATA:  Left central chest pain extending to left upper extremity. Shortness of breath. EXAM: PORTABLE CHEST 1 VIEW COMPARISON:  None. FINDINGS: The heart is mildly enlarged. Mild pulmonary vascular congestion is present. There is no frank edema. No focal airspace disease is present. There are no definite effusions. The visualized soft tissues and bony thorax are unremarkable. IMPRESSION: 1. Cardiomegaly and mild pulmonary vascular congestion is concerning for early congestive heart failure. Electronically Signed   By: Marin Roberts M.D.   On: 12/10/2016 12:54    Scheduled Meds: . enoxaparin (LOVENOX) injection  40 mg Subcutaneous Q24H  . sodium chloride flush  3 mL Intravenous Q12H  . sodium chloride flush  3 mL Intravenous Q12H   Continuous Infusions: . sodium chloride      Principal Problem:   Syncope, near Active Problems:   Chest pain   Bradycardia   Pulmonary congestion   Hypotension   Dyspnea   Acute combined systolic and diastolic congestive heart failure (HCC)    Time spent: 30    Haydee Salter  [facility] Pager  AMION. If 7PM-7AM, please contact night-coverage at [email or web address], password [facility] 12/11/2016, 6:13 PM  LOS: 0 days

## 2016-12-11 NOTE — Progress Notes (Addendum)
Progress Note  Patient Name: Micheal Cross Date of Encounter: 12/11/2016  Primary Cardiologist: Micheal PontWahid, Asif, MD  ([facility] 2015)  Subjective   Evaluated in Nuclear Medicine for Treadmill Myoview.  No recurrent chest pain or dyspnea overnight.   Inpatient Medications    Scheduled Meds: . enoxaparin (LOVENOX) injection  40 mg Subcutaneous Q24H  . sodium chloride flush  3 mL Intravenous Q12H  . sodium chloride flush  3 mL Intravenous Q12H   Continuous Infusions: . sodium chloride     PRN Meds: sodium chloride, acetaminophen **OR** acetaminophen, HYDROcodone-acetaminophen, ondansetron **OR** ondansetron (ZOFRAN) IV, senna-docusate, zolpidem   Vital Signs    Vitals:   12/11/16 1006 12/11/16 1009 12/11/16 1014 12/11/16 1017  BP: (!) 127/42 (!) 119/46 (!) 134/51 113/64  Pulse:      Resp:      Temp:      TempSrc:      SpO2:      Weight:      Height:        Intake/Output Summary (Last 24 hours) at 12/11/16 1034 Last data filed at 12/10/16 2115  Gross per 24 hour  Intake              200 ml  Output                0 ml  Net              200 ml   Filed Weights   12/10/16 1209 12/11/16 0615  Weight: 158 lb (71.7 kg) 156 lb 3.2 oz (70.9 kg)    Telemetry    Sinus bradycardia overnight with HR in the 40's. Short burst of ST at 100 6 beats. - Personally Reviewed  ECG   No new tracings.  - Personally Reviewed  Physical Exam   GEN: well nourished, Caucasian male appearing in no acute distress.   Neck: No JVD Cardiac: RRR, no murmurs, rubs, or gallops.  Respiratory: Clear to auscultation bilaterally. GI: Soft, nontender, non-distended  MS: No edema; No deformity. Neuro:  Nonfocal  Psych: Normal affect   Labs    Chemistry  Recent Labs Lab 12/10/16 1257 12/11/16 0201  NA 137 137  K 4.5 3.8  CL 105 105  CO2 27 27  GLUCOSE 99 123*  BUN 11 13  CREATININE 1.08 1.34*  CALCIUM 9.0 8.6*  GFRNONAA >60 59*  GFRAA >60 >60  ANIONGAP 5 5      Hematology  Recent Labs Lab 12/10/16 1257 12/11/16 0201  WBC 6.6 7.3  RBC 4.47 4.40  HGB 13.6 13.0  HCT 40.5 40.5  MCV 90.6 92.0  MCH 30.4 29.5  MCHC 33.6 32.1  RDW 12.7 12.8  PLT 175 173    Cardiac Enzymes  Recent Labs Lab 12/10/16 1636 12/10/16 1941  TROPONINI <0.03 <0.03     Recent Labs Lab 12/10/16 1307  TROPIPOC 0.00     BNP  Recent Labs Lab 12/10/16 1257  BNP 35.9     DDimer   Recent Labs Lab 12/10/16 1257  DDIMER <0.27     Radiology    Dg Chest Portable 1 View  Result Date: 12/10/2016 CLINICAL DATA:  Left central chest pain extending to left upper extremity. Shortness of breath. EXAM: PORTABLE CHEST 1 VIEW COMPARISON:  None. FINDINGS: The heart is mildly enlarged. Mild pulmonary vascular congestion is present. There is no frank edema. No focal airspace disease is present. There are no definite effusions. The visualized soft tissues and bony thorax are  unremarkable. IMPRESSION: 1. Cardiomegaly and mild pulmonary vascular congestion is concerning for early congestive heart failure. Electronically Signed   By: Marin Roberts M.D.   On: 12/10/2016 12:54    Cardiac Studies   Treadmill Myoview: Pending  Patient Profile     56 y.o. male with a hx of chest pain, HLD, congenital heart disease (s/p repair as a child), mitral valve prolapse, MI (maybe in 2015 with Neg Nuc stress test in 2015 now admtted with chest pain and CHF.    Assessment & Plan    1. Chest Pain/ Dyspnea on Exertion - reports history of MI in 2015 but there are no records of this.  - cardiac enzymes have remained negative and EKG shows no acute ischemic changes.  - seen in Nuclear Medicine for Treadmill Myoview. Developed a mild episode of "shooting pain" across his left pectoral region following the test but no EKG changes noted at that time. Official read pending following stress images.   2. Near syncope  - Orthostatics negative. HR averaging in the 50's overnight.  Possible short burst of SVT. - TSH elevated to 5.754. Recommend checking Free T3 and T4.  - outpatient event monitor recommended.   Signed, Micheal Lennox, PA-C  12/11/2016, 10:34 AM    History and all data above reviewed.  Patient examined.  I agree with the findings as above.  Still with some stabbing chest pain.  However, he was able to complete stage III of the treadmill. The patient exam reveals COR:RRR  ,  Lungs: Clear  ,  Abd: Positive bowel sounds, no rebound no guarding, Ext No edema  .  All available labs, radiology testing, previous records reviewed. Agree with documented assessment and plan. Chest pain:  No further work up if the stress test is negative.  Dyspnea:   Echo is pending.  Doubt HF.  BNP was normal.  CXR with question vascular congestion.  Presyncope:  Home event monitor.    Micheal Cross  11:35 AM  12/11/2016

## 2016-12-11 NOTE — Progress Notes (Signed)
  Echocardiogram 2D Echocardiogram has been performed.  Dorena Dewiffany G Kayin Osment 12/11/2016, 4:47 PM

## 2016-12-11 NOTE — Progress Notes (Signed)
Stress test showed:   IMPRESSION: 1. No reversible ischemia or infarction.  2. Normal left ventricular wall motion.  3. Left ventricular ejection fraction 58%  4. Non invasive risk stratification*: Low  Reviewed results with the patient. Will make admitting team aware. Echo is pending at this time.   Signed, Ellsworth LennoxBrittany M Ophie Burrowes, PA-C 12/11/2016, 2:13 PM Pager: [phone]

## 2016-12-11 NOTE — Progress Notes (Signed)
Chaplain visited patient and family to provide Advanced Directive Education per consult.  Patient will think about AD and if interested will let nurse know he is ready for it to be completed.  Chaplain provided ministry of presence and prayer for family.   Chaplain available as needed.  Chaplain would like to thank medical staff for caring for this patient and family.   12/11/16 1123  Clinical Encounter Type  Visited With Patient;Family;Patient and family together  Visit Type Initial;Psychological support;Spiritual support;Social support Armed forces operational officer(Advanced Directive Education)  Referral From Physician  Consult/Referral To Chaplain  Spiritual Encounters  Spiritual Needs Prayer

## 2016-12-12 ENCOUNTER — Observation Stay (HOSPITAL_COMMUNITY): Payer: 59

## 2016-12-12 ENCOUNTER — Other Ambulatory Visit: Payer: Self-pay | Admitting: Emergency Medicine

## 2016-12-12 DIAGNOSIS — R55 Syncope and collapse: Secondary | ICD-10-CM | POA: Diagnosis not present

## 2016-12-12 LAB — CBC WITH DIFFERENTIAL/PLATELET
Basophils Absolute: 0 10*3/uL (ref 0.0–0.1)
Basophils Relative: 0 %
EOS ABS: 0.1 10*3/uL (ref 0.0–0.7)
EOS PCT: 1 %
HCT: 39.5 % (ref 39.0–52.0)
Hemoglobin: 12.9 g/dL — ABNORMAL LOW (ref 13.0–17.0)
LYMPHS ABS: 2.1 10*3/uL (ref 0.7–4.0)
Lymphocytes Relative: 34 %
MCH: 29.9 pg (ref 26.0–34.0)
MCHC: 32.7 g/dL (ref 30.0–36.0)
MCV: 91.6 fL (ref 78.0–100.0)
MONOS PCT: 4 %
Monocytes Absolute: 0.3 10*3/uL (ref 0.1–1.0)
Neutro Abs: 3.8 10*3/uL (ref 1.7–7.7)
Neutrophils Relative %: 61 %
PLATELETS: 171 10*3/uL (ref 150–400)
RBC: 4.31 MIL/uL (ref 4.22–5.81)
RDW: 13 % (ref 11.5–15.5)
WBC: 6.3 10*3/uL (ref 4.0–10.5)

## 2016-12-12 LAB — CREATININE, URINE, RANDOM: Creatinine, Urine: 41.01 mg/dL

## 2016-12-12 LAB — BASIC METABOLIC PANEL
Anion gap: 5 (ref 5–15)
BUN: 13 mg/dL (ref 6–20)
CO2: 26 mmol/L (ref 22–32)
Calcium: 8.3 mg/dL — ABNORMAL LOW (ref 8.9–10.3)
Chloride: 107 mmol/L (ref 101–111)
Creatinine, Ser: 1.13 mg/dL (ref 0.61–1.24)
GFR calc Af Amer: >60 mL/min (ref >60)
GFR calc non Af Amer: >60 mL/min (ref >60)
Glucose, Bld: 102 mg/dL — ABNORMAL HIGH (ref 65–99)
Potassium: 4.1 mmol/L (ref 3.5–5.1)
Sodium: 138 mmol/L (ref 135–145)

## 2016-12-12 LAB — SODIUM, URINE, RANDOM: Sodium, Ur: 49 mmol/L

## 2016-12-12 LAB — GLUCOSE, CAPILLARY: Glucose-Capillary: 98 mg/dL (ref 65–99)

## 2016-12-12 LAB — HIV ANTIBODY (ROUTINE TESTING W REFLEX): HIV SCREEN 4TH GENERATION: NONREACTIVE

## 2016-12-12 NOTE — Discharge Instructions (Signed)
Follow with Primary MD  Medicine, [facility]  and other consultant's as instructed your Hospitalist MD  Please get a complete blood count and chemistry panel checked by your Primary MD at your next visit, and again as instructed by your Primary MD.  Get Medicines reviewed and adjusted: Please take all your medications with you for your next visit with your Primary MD  Laboratory/radiological data: Please request your Primary MD to go over all hospital tests and procedure/radiological results at the follow up, please ask your Primary MD to get all Hospital records sent to his/her office.  In some cases, they will be blood work, cultures and biopsy results pending at the time of your discharge. Please request that your primary care M.D. follows up on these results.  Also Note the following: If you experience worsening of your admission symptoms, develop shortness of breath, life threatening emergency, suicidal or homicidal thoughts you must seek medical attention immediately by calling 911 or calling your MD immediately  if symptoms less severe.  You must read complete instructions/literature along with all the possible adverse reactions/side effects for all the Medicines you take and that have been prescribed to you. Take any new Medicines after you have completely understood and accpet all the possible adverse reactions/side effects.   Do not drive when taking Pain medications or sleeping medications (Benzodaizepines)  Do not take more than prescribed Pain, Sleep and Anxiety Medications. It is not advisable to combine anxiety,sleep and pain medications without talking with your primary care practitioner  Special Instructions: If you have smoked or chewed Tobacco  in the last 2 yrs please stop smoking, stop any regular Alcohol  and or any Recreational drug use.  Wear Seat belts while driving.  Please note: You were cared for by a hospitalist during your hospital stay.  Once you are discharged, your primary care physician will handle any further medical issues. Please note that NO REFILLS for any discharge medications will be authorized once you are discharged, as it is imperative that you return to your primary care physician (or establish a relationship with a primary care physician if you do not have one) for your post hospital discharge needs so that they can reassess your need for medications and monitor your lab values.

## 2016-12-12 NOTE — Progress Notes (Addendum)
Patient received discharge instructions with wife at bedside. No new medication orders or changes. Patient has had lower than normal blood pressures according to him but did received 1 liter bolus of NS last night along with continuous iv fluids. Last BP manually 102/60. Patient ambulated around the whole unit with no distress and complaints of dizziness. Is aware of follow-up appointment next week for holter monitor with cardiologist doctor. Peripheral IV removed. Patient had no further questions. Wheeled out via volunteers.

## 2016-12-12 NOTE — Plan of Care (Signed)
Problem: Pain Managment: Goal: General experience of comfort will improve Outcome: Progressing Norco given x1 this evening for headache.   Problem: Physical Regulation: Goal: Ability to maintain clinical measurements within normal limits will improve Outcome: Progressing Patient's blood pressure has remained in the 100-110's. Intermittent chest pain still occurs but patient states it's not as frequent.   Problem: Fluid Volume: Goal: Ability to maintain a balanced intake and output will improve Outcome: Progressing I&O monitored. Post Void Residual 113 after voiding 400. Renal Ultrasound completed last night.

## 2016-12-12 NOTE — Progress Notes (Unsigned)
   Echocardiogram reviewed from yesterday which is normal. Low risk Echocardiogram yesterday. No further intervention required.  Outpatient holter monitor and follow-up appointment has been arranged.

## 2016-12-12 NOTE — Discharge Summary (Signed)
Physician Discharge Summary  Micheal Cross ZOX:[id number] DOB: [birth year] DOA: 12/10/2016  PCP: Medicine, [facility]  Admit date: 12/10/2016 Discharge date: 12/12/2016  Admitted From: HOME  Disposition: HOME   Recommendations for Outpatient Follow-up:  1. Follow up with PCP in 1 weeks.  Please follow up with cardiology for 30 day event monitor and followup.  2. Please recheck TSH and start thyroid supplement medication if appropriate for elevated TSH 3. Please obtain BMP/CBC in one week  Discharge Condition: STABLE  CODE STATUS: FULL    Brief Hospitalization Summary: Please see all hospital notes, images, labs for full details of the hospitalization.  HPI: Micheal Cross is a 56 y.o. male with medical history significant congenital heart defect surgical repair 1964, CAD, chest pain syndrome presents to emergency Department chief complaint of syncope and chest pain. Initial evaluation reveals initial troponin negative EKG without acute changes he does have a slightly soft blood pressures somewhat bradycardic. 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 He reports he was "sweating a lot". Suddenly he felt a sharp stabbing pain in his left anterior chest. Associated symptoms include shortness of breath nausea without vomiting left arm tingling. He states "I could feel myself passing out so I laid down on the ground". He states he could hear people talking around him but he could not respond. EMS was called when he awakened and they helped him up he felt "dizzy". He reports drinking lots of water and Gatorade to maintain his hydration. 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 He denies headache lower extremity edema orthopnea. He denies fever chills abdominal pain dysuria hematuria frequency or urgency. He states he's had  this in the past and he has had a stress test at which he "passed out and they told me it was a normal test". He was given aspirin and nitroglycerin have the time of admission he is pain-free.  ED Course: In the emergency department he's afebrile, Lopressor the low end of normal. Heart rate range 50-64 he is not hypoxic.  #1. Dehydration and weakness (not syncope per cardiology) -Admitted to telemetry and monitored closely -Cycled troponin and ruled out acute MI -stress test neg, low risk, Echocardiogram was also normal.  -IV fluids -Cardiology consultants arranged for outpatient 30 day event monitoring and follow up.   #2. Chest pain/pulmonary congestion. Chart review indicates patient with a history of same.  - ECHO complete and reported WNL.  - OP cardio fu arranged.   #3. Hypotension/bradycardia. Chest x-ray as noted above. EKG as noted above. May be related to a volume issue - gentle fluids given IV - OP will get heart monitor per cardio  #4. AKI - resolved now after IVF hydration Likely from low BP (ATN), pt ambulatory and well appearing Baseline Cr 1.08, Cr 1.34 today Gentle hydration given in hospital RENAL U/S within normal limits.   Code Status: full Family Communication: wife at bedside (indicate person spoken with, relationship, and if by phone, the number) Disposition Plan: home   Consultants:  cardio  Procedures:  none  Antibiotics:  n/a (indicate start date, and stop date if known)  Discharge Diagnoses:  Principal Problem:   Syncope, near Active Problems:   Chest pain   Bradycardia   Pulmonary congestion   Hypotension   Dyspnea  Acute combined systolic and diastolic congestive heart failure [facility])    Discharge Instructions: Discharge Instructions    Increase activity slowly    Complete by:  As directed      Allergies as of 12/12/2016   No Known Allergies     Medication List    TAKE these medications   acetaminophen 325 MG  tablet Commonly known as:  TYLENOL Take 650 mg by mouth every 6 (six) hours as needed for mild pain.   aspirin 81 MG chewable tablet Chew 81 mg by mouth daily.   ibuprofen 200 MG tablet Commonly known as:  ADVIL,MOTRIN Take 200 mg by mouth every 6 (six) hours as needed for moderate pain.   multivitamin tablet Take 1 tablet by mouth daily.   POTASSIUM PO Take 1 capsule by mouth daily.   vitamin E 400 UNIT capsule Take 400 Units by mouth daily.      Follow-up Information    Rollene Rotunda, MD Follow up on 01/23/2017.   Specialty:  Cardiology Why:   at 10:30 AM with his PA [facility].   Contact information: [address] [place] [address] [phone]        [facility] [address] Office Follow up.   Specialty:  Cardiology Why:   for event monitor the office will call with date and time.   Contact information: [address] [place] [address] [phone]       Medicine, [facility]. Schedule an appointment as soon as possible for a visit in 1 week(s).   Specialty:  Family Medicine Contact information: [address] [place] [address] [phone]          No Known Allergies Current Discharge Medication List    CONTINUE these medications which have NOT CHANGED   Details  acetaminophen (TYLENOL) 325 MG tablet Take 650 mg by mouth every 6 (six) hours as needed for mild pain.    aspirin 81 MG chewable tablet Chew 81 mg by mouth daily.    ibuprofen (ADVIL,MOTRIN) 200 MG tablet Take 200 mg by mouth every 6 (six) hours as needed for moderate pain.    Multiple Vitamin (MULTIVITAMIN) tablet Take 1 tablet by mouth daily.    POTASSIUM PO Take 1 capsule by mouth daily.    vitamin E 400 UNIT capsule Take 400 Units by mouth daily.       Procedures/Studies: US Renal  Result Date: 12/12/2016 CLINICAL DATA:  56 year old male with acute renal insufficiency. EXAM: RENAL / URINARY TRACT  ULTRASOUND COMPLETE COMPARISON:  None. FINDINGS: Right Kidney: Length: 11.3 cm. Echogenicity within normal limits. No mass or hydronephrosis visualized. Left Kidney: Length: 11.2 cm. Echogenicity within normal limits. No mass or hydronephrosis visualized. Bladder: Appears normal for degree of bladder distention. IMPRESSION: Unremarkable renal ultrasound. Electronically Signed   By: Elgie Collard M.D.   On: 12/12/2016 03:42   Nm Myocar Multi W/spect W/wall Motion / Ef  Result Date: 12/11/2016 CLINICAL DATA:  Chest pain EXAM: MYOCARDIAL IMAGING WITH SPECT (REST AND EXERCISE) GATED LEFT VENTRICULAR WALL MOTION STUDY LEFT VENTRICULAR EJECTION FRACTION TECHNIQUE: Standard myocardial SPECT imaging was performed after resting intravenous injection of 10 mCi Tc-30m tetrofosmin. Subsequently, exercise tolerance test was performed by the patient under the supervision of the Cardiology staff. At peak-stress, 30 mCi Tc-73m tetrofosmin was injected intravenously and standard myocardial SPECT imaging was performed. Quantitative gated imaging was also performed to evaluate left ventricular wall motion, and estimate left ventricular ejection  fraction. COMPARISON:  None. FINDINGS: Perfusion: No decreased activity in the left ventricle on stress imaging to suggest reversible ischemia or infarction. Wall Motion: Normal left ventricular wall motion. No left ventricular dilation. Left Ventricular Ejection Fraction: 58 % End diastolic volume 96 ml End systolic volume 41 ml IMPRESSION: 1. No reversible ischemia or infarction. 2. Normal left ventricular wall motion. 3. Left ventricular ejection fraction 58% 4. Non invasive risk stratification*: Low *2012 Appropriate Use Criteria for Coronary Revascularization Focused Update: J Am Coll Cardiol. 2012;59(9):857-881. http://content.dementiazones.comonlinejacc.org/article.aspx?articleid=1201161 Electronically Signed   By: Charline BillsSriyesh  Krishnan M.D.   On: 12/11/2016 13:06   Dg Chest Portable 1  View  Result Date: 12/10/2016 CLINICAL DATA:  Left central chest pain extending to left upper extremity. Shortness of breath. EXAM: PORTABLE CHEST 1 VIEW COMPARISON:  None. FINDINGS: The heart is mildly enlarged. Mild pulmonary vascular congestion is present. There is no frank edema. No focal airspace disease is present. There are no definite effusions. The visualized soft tissues and bony thorax are unremarkable. IMPRESSION: 1. Cardiomegaly and mild pulmonary vascular congestion is concerning for early congestive heart failure. Electronically Signed   By: Marin Robertshristopher  Mattern M.D.   On: 12/10/2016 12:54      Subjective: Pt says he feels much better and chest pain is almost completely gone, ready to go home today.    Discharge Exam: Vitals:   12/12/16 0100 12/12/16 0602  BP:  (!) 102/45  Pulse: (!) 57 (!) 56  Resp: 16 16  Temp:  97.8 F (36.6 C)   Vitals:   12/11/16 2019 12/11/16 2130 12/12/16 0100 12/12/16 0602  BP: 110/67 (!) 110/56  (!) 102/45  Pulse: (!) 54  (!) 57 (!) 56  Resp: 18  16 16   Temp: 98.4 F (36.9 C)   97.8 F (36.6 C)  TempSrc: Oral   Oral  SpO2: 100%  96% 95%  Weight:    71.8 kg (158 lb 4.8 oz)  Height:       General: Pt is alert, awake, not in acute distress Cardiovascular: RRR, S1/S2 +, no rubs, no gallops Respiratory: CTA bilaterally, no wheezing, no rhonchi Abdominal: Soft, NT, ND, bowel sounds + Extremities: no edema, no cyanosis   The results of significant diagnostics from this hospitalization (including imaging, microbiology, ancillary and laboratory) are listed below for reference.     Microbiology: No results found for this or any previous visit (from the past 240 hour(s)).   Labs: BNP (last 3 results)  Recent Labs  12/10/16 1257  BNP 35.9   Basic Metabolic Panel:  Recent Labs Lab 12/10/16 1257 12/11/16 0201 12/12/16 0236  NA 137 137 138  K 4.5 3.8 4.1  CL 105 105 107  CO2 27 27 26   GLUCOSE 99 123* 102*  BUN 11 13 13    CREATININE 1.08 1.34* 1.13  CALCIUM 9.0 8.6* 8.3*   Liver Function Tests: No results for input(s): AST, ALT, ALKPHOS, BILITOT, PROT, ALBUMIN in the last 168 hours. No results for input(s): LIPASE, AMYLASE in the last 168 hours. No results for input(s): AMMONIA in the last 168 hours. CBC:  Recent Labs Lab 12/10/16 1257 12/11/16 0201 12/12/16 0236  WBC 6.6 7.3 6.3  NEUTROABS 4.6  --  3.8  HGB 13.6 13.0 12.9*  HCT 40.5 40.5 39.5  MCV 90.6 92.0 91.6  PLT 175 173 171   Cardiac Enzymes:  Recent Labs Lab 12/10/16 1636 12/10/16 1941  TROPONINI <0.03 <0.03   BNP: Invalid input(s): POCBNP CBG:  Recent Labs Lab  12/11/16 0753 12/12/16 0800  GLUCAP 104* 98   D-Dimer  Recent Labs  12/10/16 1257  DDIMER <0.27   Hgb A1c No results for input(s): HGBA1C in the last 72 hours. Lipid Profile  Recent Labs  12/10/16 1257  CHOL 153  HDL 39*  LDLCALC 93  TRIG 161  CHOLHDL 3.9   Thyroid function studies  Recent Labs  12/10/16 1636  TSH 5.754*   Anemia work up No results for input(s): VITAMINB12, FOLATE, FERRITIN, TIBC, IRON, RETICCTPCT in the last 72 hours. Urinalysis No results found for: COLORURINE, APPEARANCEUR, LABSPEC, PHURINE, GLUCOSEU, HGBUR, BILIRUBINUR, KETONESUR, PROTEINUR, UROBILINOGEN, NITRITE, LEUKOCYTESUR Sepsis Labs Invalid input(s): PROCALCITONIN,  WBC,  LACTICIDVEN Microbiology No results found for this or any previous visit (from the past 240 hour(s)).  Time coordinating discharge:   SIGNED:  Standley Dakins, MD  [facility] 12/12/2016, 11:03 AM Pager [phone]  If 7PM-7AM, please contact night-coverage [email or web address] Password [email or web address]

## 2016-12-18 ENCOUNTER — Encounter: Payer: Self-pay | Admitting: Physician Assistant

## 2016-12-18 ENCOUNTER — Ambulatory Visit (INDEPENDENT_AMBULATORY_CARE_PROVIDER_SITE_OTHER): Payer: 59 | Admitting: Physician Assistant

## 2016-12-18 VITALS — BP 112/76 | HR 55 | Ht 66.0 in | Wt 160.6 lb

## 2016-12-18 DIAGNOSIS — E785 Hyperlipidemia, unspecified: Secondary | ICD-10-CM

## 2016-12-18 DIAGNOSIS — R079 Chest pain, unspecified: Secondary | ICD-10-CM | POA: Diagnosis not present

## 2016-12-18 DIAGNOSIS — I341 Nonrheumatic mitral (valve) prolapse: Secondary | ICD-10-CM | POA: Diagnosis not present

## 2016-12-18 DIAGNOSIS — Q249 Congenital malformation of heart, unspecified: Secondary | ICD-10-CM

## 2016-12-18 DIAGNOSIS — R0609 Other forms of dyspnea: Secondary | ICD-10-CM | POA: Diagnosis not present

## 2016-12-18 DIAGNOSIS — R002 Palpitations: Secondary | ICD-10-CM

## 2016-12-18 DIAGNOSIS — R0600 Dyspnea, unspecified: Secondary | ICD-10-CM

## 2016-12-18 NOTE — Patient Instructions (Addendum)
Medication Instructions:  Continue current medications  Labwork: None Ordered  Testing/Procedures: Your physician has requested that you have a Coronary Calcium Score Test. This test is done at our [facility].  Follow-Up: Your physician recommends that you schedule a follow-up appointment in: Keep appointment with Corine ShelterLuke Kilroy on 08/29 at 10:30 am   Any Other Special Instructions Will Be Listed Below (If Applicable).   If you need a refill on your cardiac medications before your next appointment, please call your pharmacy.

## 2016-12-18 NOTE — Progress Notes (Signed)
Cardiology Office Note    Date:  12/19/2016   ID:  Micheal Cross, DOB 03/14/1961, MRN [id number]  PCP:  [name], [name], DO  Cardiologist:  Dr. Antoine PocheHochrein (previously Dr. Lorelei PontAsif Wahid at [facility] in 2015)  Chief Complaint  Patient presents with  . Hospitalization Follow-up    seen for Dr. Antoine PocheHochrein    History of Present Illness:  Micheal Cross is a 56 y.o. male with PMH of chest pain, HLD, congenital heart disease repair as a child, mitral valve prolapse, questionable MI. He was previously evaluated by Dr.  Lorelei PontAsif Wahid in [facility] 2015. He had a negative nuclear stress test in 2015. More recently, he presented to the hospital was worsening shortness of breath, occasional nocturnal dyspnea and chest pain. D-dimer was negative. Chest x-ray showed cardiomegaly and mild pulmonary vascular congestion concerning for early congestive heart failure. BNP was only 35.9. Prior to EMS arrival, he also had a near-syncope spell. Overnight serial troponin was negative, he underwent Myoview on 12/11/2016. This showed EF 58%, and no reversible ischemia or infarction. Echocardiogram obtained on 12/11/2016 showed EF 55-60%, mild mitral valve prolapse involving the anterior leaflet, moderately dilated left atrium. Renal ultrasound obtained during this admission showed normal kidney size. Given his presyncopal spell, he was discharged on heart monitor.  He was supposed to follow-up in 2 months after his heart monitor, however he wished to come in today for evaluation before he gets his heart monitor. He says recently, he has been experiencing several issues including chest pain, shortness of breath, dizziness and palpitation. 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 Although hospital records seems to indicate this is presyncope, however patient says he indeed passed out for several minutes. His blood pressure was very low by the time EMS arrived.  He says the palpitations and the chest pain and passing out spell were all occurring at the same time. Since he left the hospital, he continued to experiencing worsening fatigue and exertional dyspnea along with palpitation. Given the negative Myoview recently, best course of option would be to continue with heart monitor, if it does not show significant arrhythmia, and he continued to have worsening dyspnea and chest pain, then we may consider either coronary CT with FFR versus cardiac catheterization. I did order up cardiac calcium score in order to assess his risk for having coronary artery disease as multiple members of his family had heart attack. He is aware that most of his insurance will not cover this test and that he is facing $150 out-of-pocket cost. I did not give him any nitroglycerin today as it is unclear to me was causing his symptoms and with the reported hypotension upon EMS arrival, my concern is nitroglycerin will only worsen his hypotension and syncope.    Past Medical History:  Diagnosis Date  . Congenital heart disease   . Coronary artery disease   . Mitral valve prolapse   . Myocardial infarction [facility](HCC)     Past Surgical History:  Procedure Laterality Date  . APPENDECTOMY    . ARM NEUROPLASTY    . CARDIAC SURGERY     Repair of congenital heart disease (no details)  . CHOLECYSTECTOMY      Current Medications: Outpatient Medications Prior to Visit  Medication Sig Dispense Refill  . acetaminophen (TYLENOL) 325 MG tablet Take 650 mg by mouth every 6 (six) hours as needed for mild pain.    .Marland Kitchen  aspirin 81 MG chewable tablet Chew 81 mg by mouth daily.    Marland Kitchen ibuprofen (ADVIL,MOTRIN) 200 MG tablet Take 200 mg by mouth every 6 (six) hours as needed for moderate pain.    . Multiple Vitamin (MULTIVITAMIN) tablet Take 1 tablet by mouth daily.    Marland Kitchen POTASSIUM PO Take 1 capsule by mouth daily.    . vitamin E 400 UNIT capsule Take 400 Units by mouth daily.     No  facility-administered medications prior to visit.      Allergies:   Patient has no known allergies.   Social History   Social History  . Marital status: Married    Spouse name: N/A  . Number of children: N/A  . Years of education: N/A   Social History Main Topics  . Smoking status: Never Smoker  . Smokeless tobacco: Never Used  . Alcohol use No  . Drug use: No  . Sexual activity: Not Asked   Other Topics Concern  . None   Social History Narrative  . None     Family History:  The patient's family history includes Alzheimer's disease in his mother; Bipolar disorder in his mother and sister; COPD in his mother; Colon cancer in his sister; Diabetes in his father; Heart attack in his maternal grandmother and paternal grandmother; Heart attack (age of onset: 19) in his father; Heart disease in his maternal grandfather and paternal grandfather; Hyperlipidemia in his father; Hypertension in his father; Migraines in his sister; Multiple sclerosis in his sister; Thyroid disease in his father and sister.   ROS:   Please see the history of present illness.    ROS All other systems reviewed and are negative.   PHYSICAL EXAM:   VS:  BP 112/76 (BP Location: Right Arm, Cuff Size: Normal)   Pulse (!) 55   Ht 5\' 6"  (1.676 m)   Wt 160 lb 9.6 oz (72.8 kg)   BMI 25.92 kg/m    GEN: Well nourished, well developed, in no acute distress  HEENT: normal  Neck: no JVD, carotid bruits, or masses Cardiac: RRR; no murmurs, rubs, or gallops,no edema  Respiratory:  clear to auscultation bilaterally, normal work of breathing GI: soft, nontender, nondistended, + BS MS: no deformity or atrophy  Skin: warm and dry, no rash Neuro:  Alert and Oriented x 3, Strength and sensation are intact Psych: euthymic mood, full affect  Wt Readings from Last 3 Encounters:  12/18/16 160 lb 9.6 oz (72.8 kg)  12/12/16 158 lb 4.8 oz (71.8 kg)      Studies/Labs Reviewed:   EKG:  EKG is not ordered today.    Recent Labs: 12/10/2016: B Natriuretic Peptide 35.9; TSH 5.754 12/12/2016: BUN 13; Creatinine, Ser 1.13; Hemoglobin 12.9; Platelets 171; Potassium 4.1; Sodium 138   Lipid Panel    Component Value Date/Time   CHOL 153 12/10/2016 1257   TRIG 105 12/10/2016 1257   HDL 39 (L) 12/10/2016 1257   CHOLHDL 3.9 12/10/2016 1257   VLDL 21 12/10/2016 1257   LDLCALC 93 12/10/2016 1257    Additional studies/ records that were reviewed today include:   Myoview 12/11/2016 IMPRESSION: 1. No reversible ischemia or infarction.  2. Normal left ventricular wall motion.  3. Left ventricular ejection fraction 58%  4. Non invasive risk stratification*: Low    Echo 12/11/2016 LV EF: 55% -   60%  Study Conclusions  - Left ventricle: The cavity size was normal. Wall thickness was   normal. Systolic function was normal.  The estimated ejection   fraction was in the range of 55% to 60%. Left ventricular   diastolic function parameters were normal. - Mitral valve: Calcified annulus. Mildly thickened leaflets . Mild   prolapse, involving the anterior leaflet. - Left atrium: The atrium was moderately dilated.   ASSESSMENT:    1. Chest pain, unspecified type   2. DOE (dyspnea on exertion)   3. Palpitation   4. Hyperlipidemia, unspecified hyperlipidemia type   5. Congenital heart disease   6. Mitral valve prolapse      PLAN:  In order of problems listed above:  1. Chest pain and dyspnea on exertion: Occurring at the same time as the palpitation. Recent Myoview reassuring. Pending upcoming event monitor. I discussed his case with Dr. Rennis Golden DOD, our plan is if the event monitor captures arrhythmia, will treat appropriately, however if he continued to have symptoms despite negative finding on the event monitor, will need to consider either coronary CT versus cardiac catheterization. I did not give him a prescription for nitroglycerin today due to reported EMS finding of hypotension upon arrival  right after syncope. He is very concerned given his family history of heart attacks in multiple member, I will obtain a cardiac calcium score for risk stratification.  2. Palpitation: Pending event monitor and then follow-up with Corine Shelter PA-C  3. Hyperlipidemia: Recent lipid panel total cholesterol 153, triglyceride 15, HDL 39, LDL 93. Recommend diet and exercise.  4. Congenital heart disease: He says he had some kind of surgery as a baby on his heart, however he does not know any more than this.  5. Mitral valve prolapse: Seen on recent echocardiogram, stable.  6. Elevated TSH: Free T4 and a T3 seems to be normal, we'll defer to primary care provider.    Medication Adjustments/Labs and Tests Ordered: Current medicines are reviewed at length with the patient today.  Concerns regarding medicines are outlined above.  Medication changes, Labs and Tests ordered today are listed in the Patient Instructions below. Patient Instructions  Medication Instructions:  Continue current medications  Labwork: None Ordered  Testing/Procedures: Your physician has requested that you have a Coronary Calcium Score Test. This test is done at our [facility].  Follow-Up: Your physician recommends that you schedule a follow-up appointment in: Keep appointment with Corine Shelter on 08/29 at 10:30 am   Any Other Special Instructions Will Be Listed Below (If Applicable).   If you need a refill on your cardiac medications before your next appointment, please call your pharmacy.     Ramond Dial, [place]  12/19/2016 5:31 AM    [facility] [address] [place], [place], [place]  [address] Phone: [phone]; Fax: [phone]

## 2016-12-19 ENCOUNTER — Encounter: Payer: Self-pay | Admitting: Physician Assistant

## 2016-12-20 ENCOUNTER — Ambulatory Visit: Payer: 59

## 2016-12-20 ENCOUNTER — Ambulatory Visit (INDEPENDENT_AMBULATORY_CARE_PROVIDER_SITE_OTHER)
Admission: RE | Admit: 2016-12-20 | Discharge: 2016-12-20 | Disposition: A | Discharge status: Home / Self Care | Payer: Self-pay | Source: Ambulatory Visit | Attending: Physician Assistant | Admitting: Physician Assistant

## 2016-12-20 DIAGNOSIS — R079 Chest pain, unspecified: Secondary | ICD-10-CM

## 2016-12-20 DIAGNOSIS — I495 Sick sinus syndrome: Secondary | ICD-10-CM

## 2016-12-20 DIAGNOSIS — R55 Syncope and collapse: Secondary | ICD-10-CM

## 2016-12-21 NOTE — Progress Notes (Signed)
I ordered a cardiac calcium score on this patient, not sure which cardiologist suppose to read it. I see you are rounding next week, if it is still not read by then, can you take a look?

## 2016-12-23 ENCOUNTER — Encounter: Payer: Self-pay | Admitting: Cardiology

## 2016-12-24 ENCOUNTER — Encounter: Payer: Self-pay | Admitting: Physician Assistant

## 2016-12-25 ENCOUNTER — Encounter: Payer: Self-pay | Admitting: Physician Assistant

## 2016-12-26 ENCOUNTER — Encounter: Payer: Self-pay | Admitting: Physician Assistant

## 2016-12-26 NOTE — Telephone Encounter (Signed)
I have called the patient, earlier today, Micheal Cross our nurse has pulled the monitor recording from 7/28 and 7/29, I have personally reviewed the patient triggered events, there were no ventricular ectopy, no PVCs or PACs, it only showed sinus rhythm without any arrhythmia. I recommended continue recording with event monitor for now, he is quite worried and will contact us again this Friday. If symptom unrelieved, will need earlier visit on a day Dr. Antoine PocheHochrein is also in the office to decide if Dr. Antoine PocheHochrein would recommend definitive assessment via cardiac cath (recent negative workup include stress myoview and 0 coronary calcium score on CT)

## 2016-12-26 NOTE — Telephone Encounter (Signed)
I have called the patient, please try to see if event monitor can give us a report on what his monitor showed in the last 6 days

## 2016-12-26 NOTE — Telephone Encounter (Signed)
Pt returned called please give him a call back.

## 2016-12-26 NOTE — Telephone Encounter (Signed)
I have explained to the patient, given persistent symptom, I recommended against driving heavy equipment

## 2016-12-28 ENCOUNTER — Encounter: Payer: Self-pay | Admitting: Cardiology

## 2016-12-31 ENCOUNTER — Ambulatory Visit (INDEPENDENT_AMBULATORY_CARE_PROVIDER_SITE_OTHER): Payer: 59 | Admitting: Physician Assistant

## 2016-12-31 ENCOUNTER — Encounter: Payer: Self-pay | Admitting: *Deleted

## 2016-12-31 ENCOUNTER — Encounter: Payer: Self-pay | Admitting: Physician Assistant

## 2016-12-31 VITALS — BP 115/68 | HR 66 | Ht 65.5 in | Wt 165.4 lb

## 2016-12-31 DIAGNOSIS — Q249 Congenital malformation of heart, unspecified: Secondary | ICD-10-CM | POA: Diagnosis not present

## 2016-12-31 DIAGNOSIS — R072 Precordial pain: Secondary | ICD-10-CM

## 2016-12-31 DIAGNOSIS — R002 Palpitations: Secondary | ICD-10-CM

## 2016-12-31 DIAGNOSIS — R0600 Dyspnea, unspecified: Secondary | ICD-10-CM

## 2016-12-31 DIAGNOSIS — I341 Nonrheumatic mitral (valve) prolapse: Secondary | ICD-10-CM | POA: Diagnosis not present

## 2016-12-31 DIAGNOSIS — R0609 Other forms of dyspnea: Secondary | ICD-10-CM

## 2016-12-31 DIAGNOSIS — E785 Hyperlipidemia, unspecified: Secondary | ICD-10-CM

## 2016-12-31 MED ORDER — METOPROLOL TARTRATE 25 MG PO TABS: 12.5000 mg | ORAL_TABLET | Freq: Two times a day (BID) | ORAL | 3 refills | Status: AC

## 2016-12-31 NOTE — Patient Instructions (Signed)
Medication Instructions:   START METOPROLOL TARTRATE 12.5 MG TWICE DAILY= 1/2 OF THE 25 MG TABLET TWICE DAILY  Follow-Up:  Your physician recommends that you schedule a follow-up appointment in: AS SCHEDULED   TRACK BLOOD PRESSURE AND PULSE AND BRING TO FOLLOW UP APPOINTMENT

## 2016-12-31 NOTE — Progress Notes (Signed)
Cardiology Office Note    Date:  01/01/2017   ID:  Micheal Cross, DOB [birth year], MRN [id number]  PCP:  Stacie Glaze, DO  Cardiologist:  Dr. Percival Spanish (previously Dr. Verdell Face at [facility] in 2015)  Chief Complaint  Patient presents with  . Follow-up    seen for Dr. Percival Spanish. Persistent chest pain and DOE    History of Present Illness:  Micheal Cross is a 56 y.o. male with PMH of chest pain, HLD, congenital heart disease repair as a child, mitral valve prolapse, questionable MI. He was previously evaluated by Dr.  Verdell Face in [facility] 2015. He had a negative nuclear stress test in 2015. More recently, he presented to the hospital was worsening shortness of breath, occasional nocturnal dyspnea and chest pain. D-dimer was negative. Chest x-ray showed cardiomegaly and mild pulmonary vascular congestion concerning for early congestive heart failure. BNP was only 35.9. Prior to EMS arrival, he also had a near-syncope spell. Overnight serial troponin was negative, he underwent Myoview on 12/11/2016. This showed EF 58%, and no reversible ischemia or infarction. Echocardiogram obtained on 12/11/2016 showed EF 55-60%, mild mitral valve prolapse involving the anterior leaflet, moderately dilated left atrium. Renal ultrasound obtained during this admission showed normal kidney size. Given his presyncopal spell, he was discharged on heart monitor.  He continued to have almost daily onset of chest pain, shortness of breath and weakness. His dizziness hasn't been too severe. He says last night's episode was particularly worse and he had chest pain that lasted for entire day yesterday. He denies any recent severe elevated blood pressure. He described the left-sided chest pain under the left armpit radiating to the back. Although he does not have any risk factors for aortic dissection. I have went over his recent monitor report from July 26 until August 3, he did have 2 episodes of SVT both lasted less than  4 seconds with heart rate of 150. I do not think they are responsible for any of his symptom and this is more of incidental finding. I will however add metoprolol for rate control and suppression of PVCs and SVT. At this point, I have discussed the case with Dr. Percival Spanish, given recent negative coronary calcium score of 0, the probability of him having significant coronary artery disease is extremely low. We do not recommend any cardiac catheterization or coronary CT at the time. If symptoms persist, may consider chest CTA to rule out aortic dissection although probability is also very low. We'll defer to primary care physician for additional workup.   Past Medical History:  Diagnosis Date  . Congenital heart disease   . Coronary artery disease   . Mitral valve prolapse   . Myocardial infarction [facility])     Past Surgical History:  Procedure Laterality Date  . APPENDECTOMY    . ARM NEUROPLASTY    . CARDIAC SURGERY     Repair of congenital heart disease (no details)  . CHOLECYSTECTOMY      Current Medications: Outpatient Medications Prior to Visit  Medication Sig Dispense Refill  . acetaminophen (TYLENOL) 325 MG tablet Take 650 mg by mouth every 6 (six) hours as needed for mild pain.    Marland Kitchen aspirin 81 MG chewable tablet Chew 81 mg by mouth daily.    Marland Kitchen ibuprofen (ADVIL,MOTRIN) 200 MG tablet Take 200 mg by mouth every 6 (six) hours as needed for moderate pain.    . Multiple Vitamin (MULTIVITAMIN) tablet Take 1 tablet by mouth daily.    Marland Kitchen  POTASSIUM PO Take 1 capsule by mouth daily.    . vitamin E 400 UNIT capsule Take 400 Units by mouth daily.     No facility-administered medications prior to visit.      Allergies:   Patient has no known allergies.   Social History   Social History  . Marital status: Married    Spouse name: Micheal Cross  . Number of children: Micheal Cross  . Years of education: Micheal Cross   Social History Main Topics  . Smoking status: Never Smoker  . Smokeless tobacco: Never Used  .  Alcohol use No  . Drug use: No  . Sexual activity: Not Asked   Other Topics Concern  . None   Social History Narrative  . None     Family History:  The patient's family history includes Alzheimer's disease in his mother; Bipolar disorder in his mother and sister; COPD in his mother; Colon cancer in his sister; Diabetes in his father; Heart attack in his maternal grandmother and paternal grandmother; Heart attack (age of onset: 22) in his father; Heart disease in his maternal grandfather and paternal grandfather; Hyperlipidemia in his father; Hypertension in his father; Migraines in his sister; Multiple sclerosis in his sister; Thyroid disease in his father and sister.   ROS:   Please see the history of present illness.    ROS All other systems reviewed and are negative.   PHYSICAL EXAM:   VS:  BP 115/68   Pulse 66   Ht 5' 5.5" (1.664 m)   Wt 165 lb 6.4 oz (75 kg)   BMI 27.11 kg/m    GEN: Well nourished, well developed, in no acute distress  HEENT: normal  Neck: no JVD, carotid bruits, or masses Cardiac: RRR; no murmurs, rubs, or gallops,no edema  Respiratory:  clear to auscultation bilaterally, normal work of breathing GI: soft, nontender, nondistended, + BS MS: no deformity or atrophy  Skin: warm and dry, no rash Neuro:  Alert and Oriented x 3, Strength and sensation are intact Psych: euthymic mood, full affect  Wt Readings from Last 3 Encounters:  12/31/16 165 lb 6.4 oz (75 kg)  12/18/16 160 lb 9.6 oz (72.8 kg)  12/12/16 158 lb 4.8 oz (71.8 kg)      Studies/Labs Reviewed:   EKG:  EKG is ordered today.  The ekg ordered today demonstrates Normal sinus rhythm without significant ST-T wave changes  Recent Labs: 12/10/2016: B Natriuretic Peptide 35.9; TSH 5.754 12/12/2016: BUN 13; Creatinine, Ser 1.13; Hemoglobin 12.9; Platelets 171; Potassium 4.1; Sodium 138   Lipid Panel    Component Value Date/Time   CHOL 153 12/10/2016 1257   TRIG 105 12/10/2016 1257   HDL 39  (L) 12/10/2016 1257   CHOLHDL 3.9 12/10/2016 1257   VLDL 21 12/10/2016 1257   Hardy 93 12/10/2016 1257    Additional studies/ records that were reviewed today include:   Myoview 12/11/2016 IMPRESSION: 1. No reversible ischemia or infarction.  2. Normal left ventricular wall motion.  3. Left ventricular ejection fraction 58%  4. Non invasive risk stratification*: Low    Echo 12/11/2016 LV EF: 55% - 60%  Study Conclusions  - Left ventricle: The cavity size was normal. Wall thickness was normal. Systolic function was normal. The estimated ejection fraction was in the range of 55% to 60%. Left ventricular diastolic function parameters were normal. - Mitral valve: Calcified annulus. Mildly thickened leaflets . Mild prolapse, involving the anterior leaflet. - Left atrium: The atrium was moderately dilated.  ASSESSMENT:    1. Precordial pain   2. DOE (dyspnea on exertion)   3. Palpitation   4. Hyperlipidemia, unspecified hyperlipidemia type   5. Congenital heart disease   6. Mitral valve prolapse      PLAN:  In order of problems listed above:  1. Chest pain: He has seen multiple times for chest pain, Myoview was negative in the hospital. Due to persistent chest pain I also ordered a coronary calcium score the last time I saw him. Coronary calcium score was 0. He does not have any recent severely elevated blood pressure that would put him at risk for aortic dissection. I have discussed his case with Dr. Debara Pickett before during the last office visit due to his concern and constant worry, I have also discussed his case today with his primary cardiologist Dr. Percival Spanish as well due to persistent symptoms. At this point, Dr. Percival Spanish does not believe coronary CT or cardiac catheterization would benefit him. May also consider ESR and CRP on followup to see if any possibility of pericarditis and perimyocarditis.  2. Palpitation: I have reviewed his recent heart  monitor result dating from July 26 until August 3, there were 2 episodes were he had short bursts of SVT that lasted less than 4 seconds. I will add low-dose metoprolol. We will suppress the episode. However I do not think this is responsible for his chest pain or dyspnea on exertion as to duration and is very short and very infrequent. I think this is likely incidental finding.  3. Hyperlipidemia: Recommend diet exercise, recent lipid panel showed total cholesterol 153, triglyceride 105, HDL 39, LDL 93.   4. Congenital heart disease: He says he had some kind of surgery as a baby on his heart, he does have an incision under his left armpit: Unfortunately we do not know what type of surgery was performed. His recent echocardiogram did not pick up any abnormality. At some point we can consider MRI of his heart, however I don't think this is related to his current symptom.  5. Elevated TSH: We'll defer to his primary care provider  6. Mitral valve prolapse: Seen on recent echocardiogram, continue observation.    Medication Adjustments/Labs and Tests Ordered: Current medicines are reviewed at length with the patient today.  Concerns regarding medicines are outlined above.  Medication changes, Labs and Tests ordered today are listed in the Patient Instructions below. Patient Instructions  Medication Instructions:   START METOPROLOL TARTRATE 12.5 MG TWICE DAILY= 1/2 OF THE 25 MG TABLET TWICE DAILY  Follow-Up:  Your physician recommends that you schedule a follow-up appointment in: AS SCHEDULED   TRACK BLOOD PRESSURE AND PULSE AND BRING TO FOLLOW UP APPOINTMENT      Hilbert Corrigan, [place]  01/01/2017 11:34 PM    [facility] [address], [place], [place]  [address] Phone: [phone]; Fax: [phone]

## 2017-01-01 ENCOUNTER — Encounter: Payer: Self-pay | Admitting: Physician Assistant

## 2017-01-16 ENCOUNTER — Telehealth: Payer: Self-pay | Admitting: Cardiology

## 2017-01-16 ENCOUNTER — Telehealth: Payer: Self-pay | Admitting: *Deleted

## 2017-01-16 NOTE — Telephone Encounter (Signed)
Patient wife calling, states that she would like to verify when patient needs to ship monitor back.

## 2017-01-16 NOTE — Telephone Encounter (Signed)
Confirmed last day of service on patients Cardiac event monitor is 01/19/17.  He can ship it back directly to [employer] using the prepaid UPS box the monitor came in.

## 2017-01-17 ENCOUNTER — Telehealth: Payer: Self-pay | Admitting: Cardiology

## 2017-01-17 NOTE — Telephone Encounter (Signed)
[employer] called stating that patient had pressed the button in monitor that he had passed out. Rhythm was SB in the 50's.  I contacted patient and he did not pass out and the button was pressed accidentally.

## 2017-01-23 ENCOUNTER — Ambulatory Visit (INDEPENDENT_AMBULATORY_CARE_PROVIDER_SITE_OTHER): Payer: 59 | Admitting: Cardiology

## 2017-01-23 ENCOUNTER — Encounter: Payer: Self-pay | Admitting: Cardiology

## 2017-01-23 DIAGNOSIS — I471 Supraventricular tachycardia: Secondary | ICD-10-CM | POA: Diagnosis not present

## 2017-01-23 DIAGNOSIS — R55 Syncope and collapse: Secondary | ICD-10-CM

## 2017-01-23 DIAGNOSIS — R079 Chest pain, unspecified: Secondary | ICD-10-CM

## 2017-01-23 DIAGNOSIS — Z8249 Family history of ischemic heart disease and other diseases of the circulatory system: Secondary | ICD-10-CM | POA: Diagnosis not present

## 2017-01-23 DIAGNOSIS — Z9889 Other specified postprocedural states: Secondary | ICD-10-CM | POA: Diagnosis not present

## 2017-01-23 DIAGNOSIS — R001 Bradycardia, unspecified: Secondary | ICD-10-CM

## 2017-01-23 DIAGNOSIS — I4719 Other supraventricular tachycardia: Secondary | ICD-10-CM | POA: Insufficient documentation

## 2017-01-23 NOTE — Patient Instructions (Signed)
Medication Instructions: Your physician recommends that you continue on your current medications as directed. Please refer to the Current Medication list given to you today.  If you need a refill on your cardiac medications before your next appointment, please call your pharmacy.    Follow-Up: Your physician wants you to follow-up in: 6 months with Dr. Hochrein. You will receive a reminder letter in the mail two months in advance. If you don't receive a letter, please call our office to schedule this follow-up appointment.    Thank you for choosing [facility]!!      

## 2017-01-23 NOTE — Assessment & Plan Note (Signed)
PAT seen on monitor

## 2017-01-23 NOTE — Assessment & Plan Note (Signed)
Pt admitted 12/10/16 with a near syncopal episode at work.  30 day event monitor shows periods of bradycardia and brief PSVT

## 2017-01-23 NOTE — Assessment & Plan Note (Signed)
Atypical for angina- sharp- brief Myoview low risk, coronary Ca++ score 0

## 2017-01-23 NOTE — Progress Notes (Signed)
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 He presented to the ED at [facility] 12/10/16 after a near syncopal spell at work. The pt says he knew he was going down. No injury. He was admitted and multiple tests done. Some of these test results were confusing to him and his wife. He was told he had CHF on his CXR but then he was told he was dehydrated. He was admitted and monitored. His echo was normal (except for moderately enlarged LA) and his Myoview was negative. He was discharged and set up for a 30 day monitor.  After discharge there were multiple phone calls from the pt about symptoms of chest pain, fatigue, and palpitations. He does have a FM Hx of CAD. A Coronary Ca+ score was done-result was 0. He saw [name] two weeks ago and and his monitor was unrevealing. He is in the office today seeing me to review the final two weeks.  Review of his monitor shows he marked multiple episodes with symptoms of chest pain, tachycardia, and fatigue that showed only NSR. He did have a few episodes of bradycardia with rates down to 41 and brief PAT which were also noted by the pt. To compound the pt's anxiety he was called in the middle of the night to "make sure I was OK" and on another occasion he was told he had to go the hospital right away. The last turned out to be a call to the wrong patient.   Fortunately he has done pretty well, no further near syncopal spells he is back to work. He denies any OTC drug use or recreational drug use. He does admit to taking several vitamins-"like 10 a day". He denies excessive caffeine intake. 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 He was told  to stop those and his symptoms resolved.    Current Outpatient Prescriptions  Medication Sig Dispense Refill  . acetaminophen (TYLENOL) 325 MG tablet Take 650 mg by mouth every 6 (six) hours as needed for mild pain.    Marland Kitchen aspirin 81 MG chewable tablet Chew 81 mg by mouth daily.    Marland Kitchen ibuprofen (ADVIL,MOTRIN) 200 MG tablet Take 200 mg by mouth every 6 (six) hours as needed for moderate pain.    Marland Kitchen levothyroxine (SYNTHROID, LEVOTHROID) 25 MCG tablet Take 25 mcg by mouth daily.    . Magnesium 250 MG TABS Take 1 tablet by mouth daily.    . metoprolol tartrate (LOPRESSOR) 25 MG tablet Take 0.5 tablets (12.5 mg total) by mouth 2 (two) times daily. 90 tablet 3  . Multiple Vitamin (MULTIVITAMIN) tablet Take 1 tablet by mouth daily.    . Omega-3 Fatty Acids (FISH OIL) 1200 MG CAPS Take 1 capsule by mouth daily as needed.    Marland Kitchen POTASSIUM PO Take 1 capsule by mouth daily.    . vitamin B-12 (CYANOCOBALAMIN) 500 MCG tablet Take 500 mcg by mouth daily.    . vitamin E 400 UNIT capsule Take 400 Units by mouth daily.     No current facility-administered medications for this visit.     No Known Allergies  Past Medical History:  Diagnosis Date  .  Congenital heart disease   . Coronary artery disease   . Mitral valve prolapse   . Myocardial infarction [facility])     Social History   Social History  . Marital status: Married    Spouse name: N/A  . Number of children: N/A  . Years of education: N/A   Occupational History  . Not on file.   Social History Main Topics  . Smoking status: Never Smoker  . Smokeless tobacco: Never Used  . Alcohol use No  . Drug use: No  . Sexual activity: Not on file   Other Topics Concern  . Not on file   Social History Narrative  . No narrative on file     Family History  Problem Relation Age of Onset  . COPD Mother   . Bipolar disorder Mother   . Alzheimer's disease Mother   . Heart attack Father 9  . Diabetes Father   . Hypertension Father   .  Hyperlipidemia Father   . Thyroid disease Father   . Colon cancer Sister   . Bipolar disorder Sister   . Migraines Sister   . Thyroid disease Sister   . Multiple sclerosis Sister   . Heart attack Maternal Grandmother   . Heart disease Maternal Grandfather   . Heart attack Paternal Grandmother   . Heart disease Paternal Grandfather      Review of Systems: General: negative for chills, fever, night sweats or weight changes.  Cardiovascular: negative for dyspnea on exertion, edema, orthopnea, paroxysmal nocturnal dyspnea or shortness of breath Dermatological: negative for rash Respiratory: negative for cough or wheezing Urologic: negative for hematuria Abdominal: negative for nausea, vomiting, diarrhea, bright red blood per rectum, melena, or hematemesis Neurologic: negative for visual changes, syncope, or dizziness All other systems reviewed and are otherwise negative except as noted above.    Blood pressure 104/68, pulse (!) 56, height 5' 5.5" (1.664 m), weight 162 lb (73.5 kg).  General appearance: alert, cooperative and no distress Neck: no carotid bruit and no JVD Lungs: clear to auscultation bilaterally Heart: regular rate and rhythm Extremities: extremities normal, atraumatic, no cyanosis or edema Skin: Skin color, texture, turgor normal. No rashes or lesions Neurologic: Grossly normal   ASSESSMENT AND PLAN:   Syncope, near Pt admitted 12/10/16 with a near syncopal episode at work.  30 day event monitor shows periods of bradycardia and brief PSVT  Chest pain Atypical for angina- sharp- brief Myoview low risk, coronary Ca++ score 0  Family history of coronary artery disease Family history of CAD - father had an MI in his 55's   Bradycardia HR as low as 41 seen on monitor  PAT (paroxysmal atrial tachycardia) (HCC) PAT seen on monitor   PLAN  I suggested [name] talk to his PCP about his vitamin use, it may be all he needs is a daily MVI. We talked about  ways to break PAT if it recurs, valsalva maneuver, cold water on his face. I did not want to change his beta blocker dose at this time, it's not clear the episodic bradycardia is bothering him. I explained he may have a "short circuit" possibly from his remote heart surgery, and that this may be an issue for him in the future (he does have moderate LA dilatation ). I reassured him that his heart function was normal and we did not feel he had significant CAD. He did ask about being cleared to drive and I told him I would discuss that with Dr  Hochrein.   Corine ShelterLuke Enya Bureau PA-C 01/23/2017 1:15 PM

## 2017-01-23 NOTE — Assessment & Plan Note (Signed)
Family history of CAD - father had an MI in his 4950's

## 2017-01-23 NOTE — Assessment & Plan Note (Signed)
HR as low as 41 seen on monitor

## 2017-12-07 IMAGING — CT CT HEART SCORING
2 series · 16 of 20 positions shown, 18 images · non-contrast
Comparison: None.

CLINICAL DATA: Risk stratification

EXAM:
Coronary Calcium Score
TECHNIQUE: The patient was scanned on a Siemens Somatom 64 slice scanner. Axial
non-contrast 3 mm slices were carried out through the heart. The
data set was analyzed on a dedicated work station and scored using
the Agatson method.

[Series 2: casc 3.0 i36f 2 bestdiast 70 % · axial · 0.38mm/px · z∈[-224,-131]mm · 8 of 41 slices shown, 10 images]
[im 5/41  vessel]
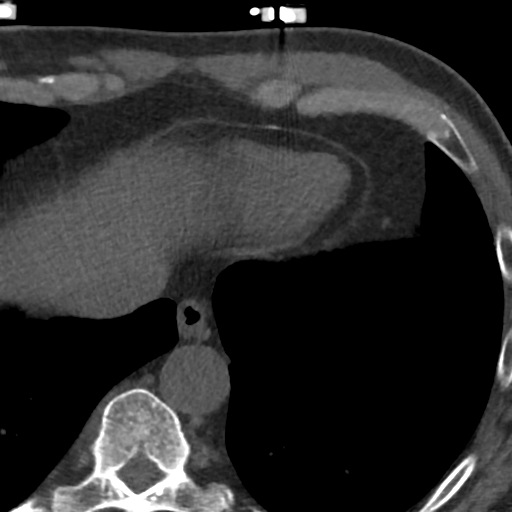
[im 5/41  lung]
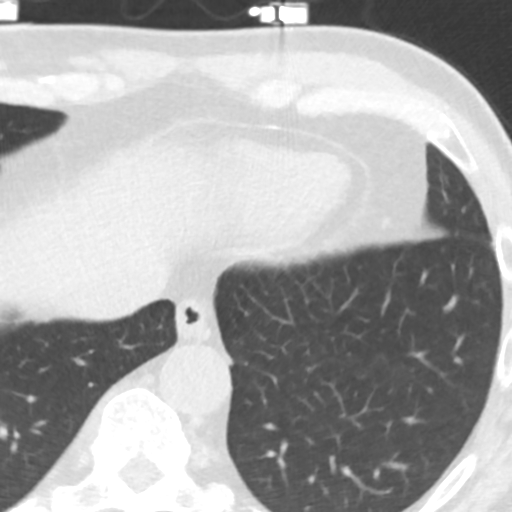
[im 9/41  vessel]
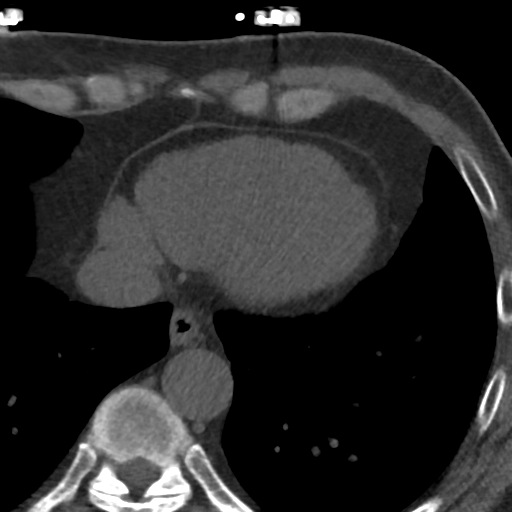
[im 14/41  vessel]
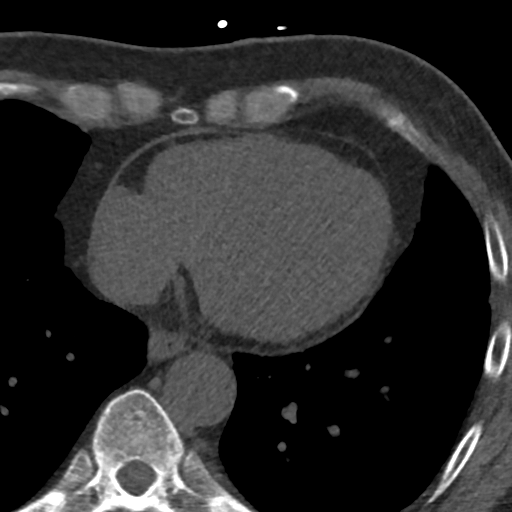
[im 18/41  vessel]
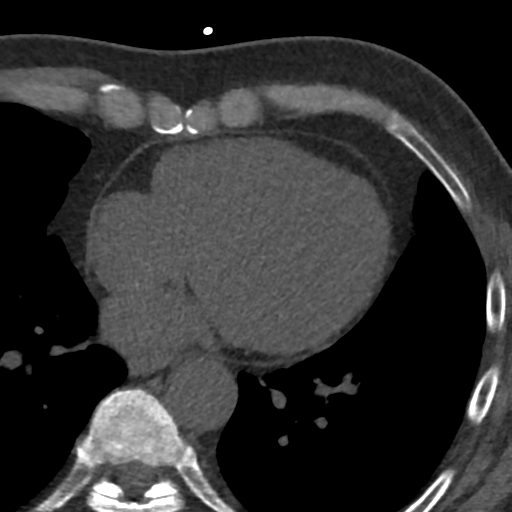
[im 23/41  vessel]
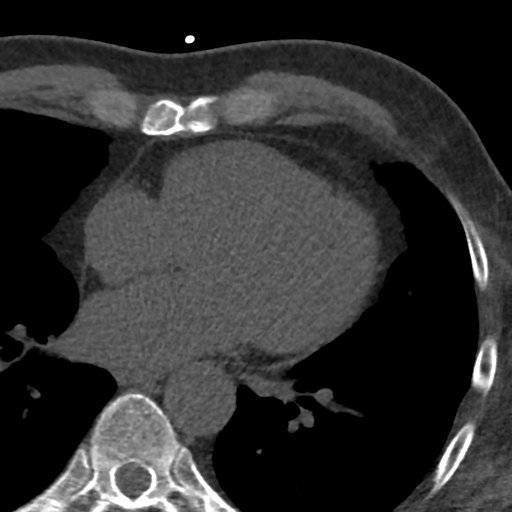
[im 23/41  lung]
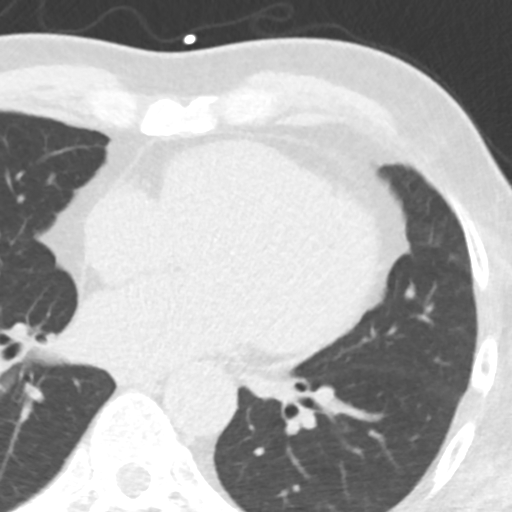
[im 27/41  vessel]
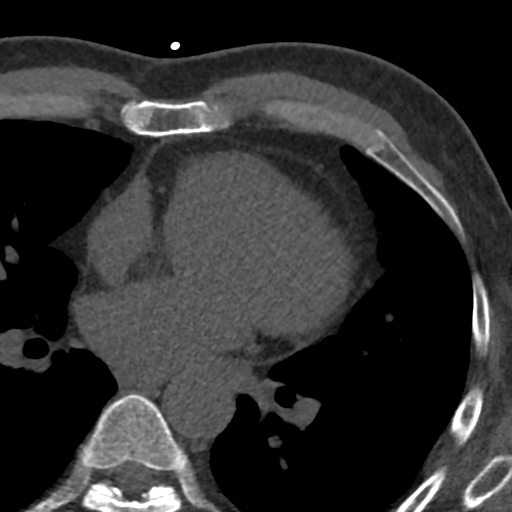
[im 32/41  vessel]
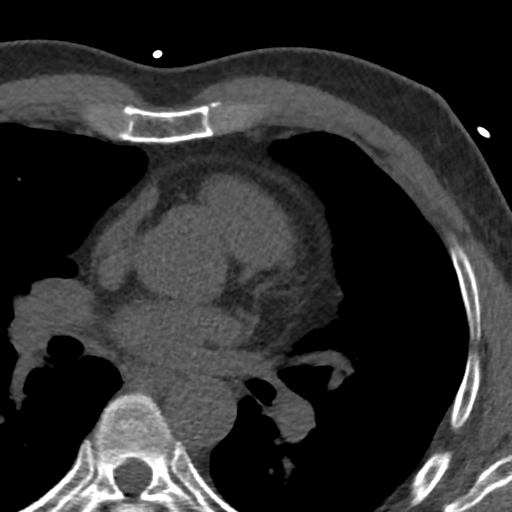
[im 36/41  vessel]
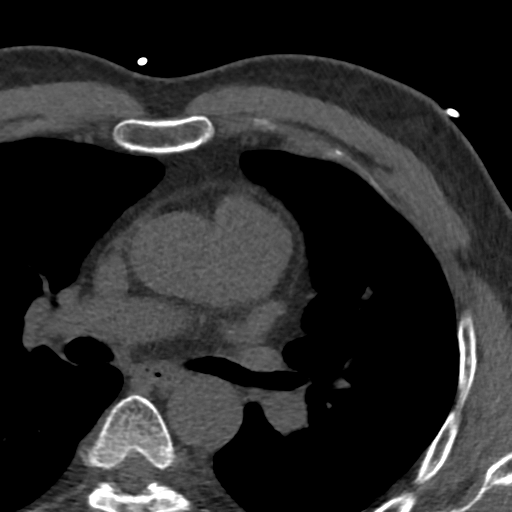

[Series 4: lung st 70 % · axial · 0.68mm/px · z∈[-227,-131]mm · 8 of 42 slices shown]
[im 5/42  lung]
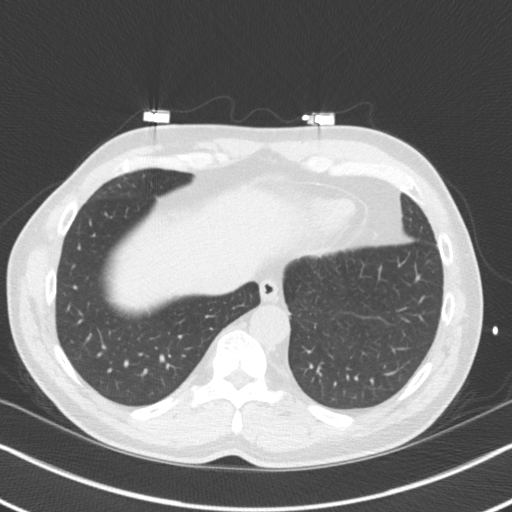
[im 10/42  lung]
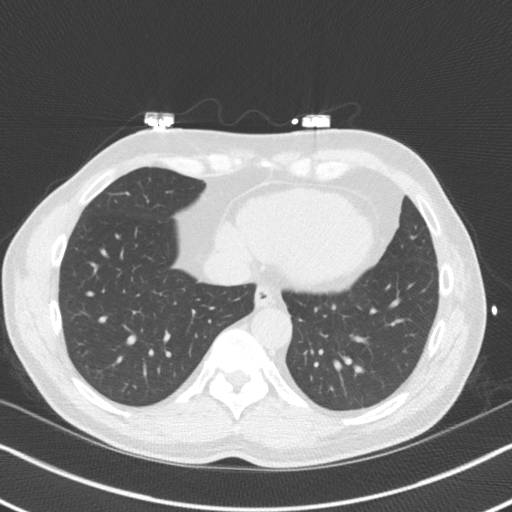
[im 14/42  lung]
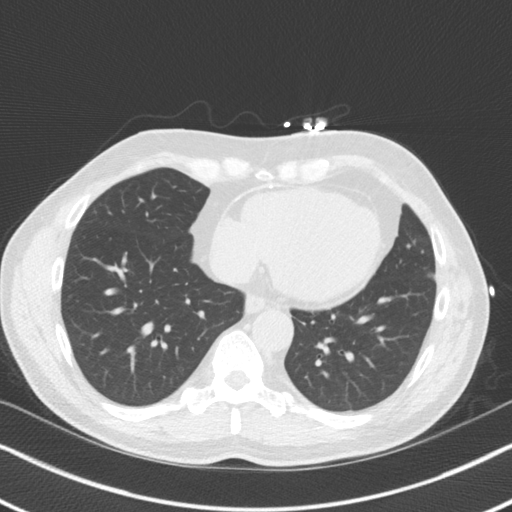
[im 19/42  lung]
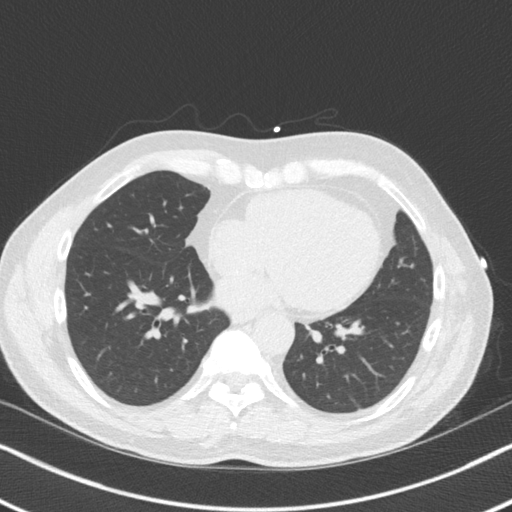
[im 23/42  lung]
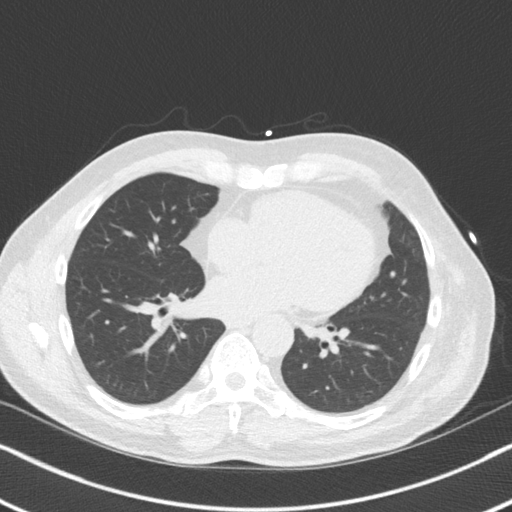
[im 28/42  lung]
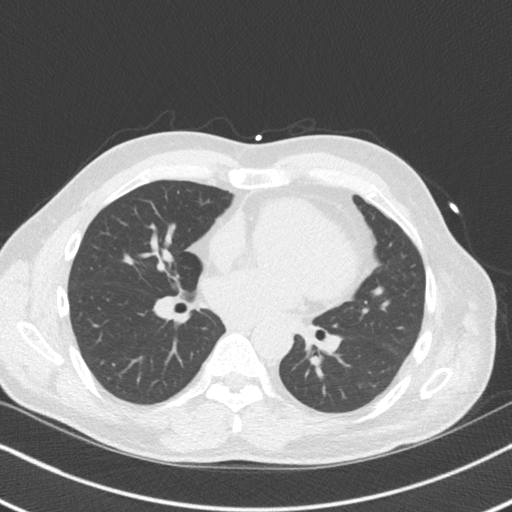
[im 32/42  lung]
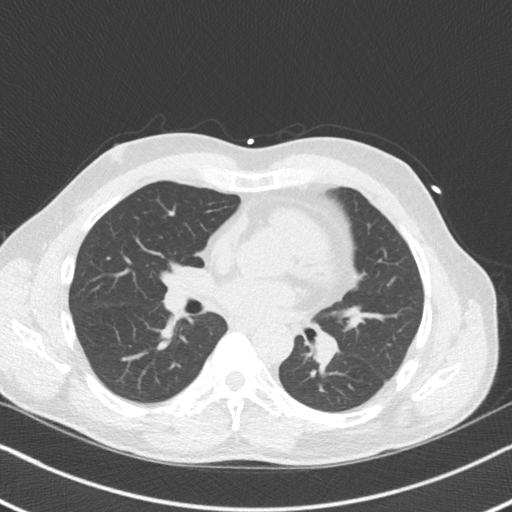
[im 37/42  lung]
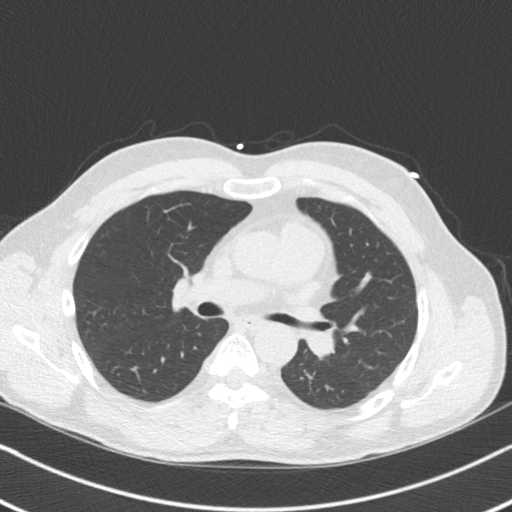

[16 of 20 positions shown; findings below may reference images not displayed]

FINDINGS: Non-cardiac: See separate report from [REDACTED].

Ascending Aorta:  3.2 cm

Pericardium: Normal

Coronary arteries:  No calcium detected
IMPRESSION: Coronary calcium score of 0.

Savio Locklear

EXAM:
OVER-READ INTERPRETATION  CT CHEST

The following report is an over-read performed by radiologist Dr.
Wilson Gonzalo Azula [REDACTED] on 12/20/2016. This over-read
does not include interpretation of cardiac or coronary anatomy or
pathology. The coronary calcium score interpretation by the
cardiologist is attached.
FINDINGS: Cardiovascular: Heart is normal size. Visualized aorta normal
caliber.

Mediastinum/Nodes: No adenopathy in the lower mediastinum or hila.

Lungs/Pleura: Visualized lungs are clear.

Upper Abdomen: Imaging into the upper abdomen shows no acute
findings.

Musculoskeletal: Chest wall soft tissues are unremarkable. No acute
bony abnormality.
IMPRESSION: No acute or significant extracardiac abnormality.

## 2018-10-15 ENCOUNTER — Telehealth: Payer: Self-pay | Admitting: Cardiology

## 2018-10-15 NOTE — Telephone Encounter (Signed)
LMTCB to schedule follow up appt with Dr. Antoine Poche. Called from recall list.

## 2018-11-26 ENCOUNTER — Telehealth: Payer: Self-pay | Admitting: *Deleted

## 2018-11-26 NOTE — Telephone Encounter (Signed)
A message was left, re: follow up visit.
